# Patient Record
Sex: Female | Born: 1945 | Race: White | Hispanic: No | Marital: Married | Smoking: Never smoker
Health system: Southern US, Community
[De-identification: ages and names within clinical notes are randomized; demographics above are authoritative.]

## PROBLEM LIST (undated history)

## (undated) DIAGNOSIS — I341 Nonrheumatic mitral (valve) prolapse: Secondary | ICD-10-CM

## (undated) DIAGNOSIS — F329 Major depressive disorder, single episode, unspecified: Secondary | ICD-10-CM

## (undated) DIAGNOSIS — K219 Gastro-esophageal reflux disease without esophagitis: Secondary | ICD-10-CM

## (undated) DIAGNOSIS — M8580 Other specified disorders of bone density and structure, unspecified site: Secondary | ICD-10-CM

## (undated) DIAGNOSIS — K449 Diaphragmatic hernia without obstruction or gangrene: Secondary | ICD-10-CM

## (undated) DIAGNOSIS — R51 Headache: Secondary | ICD-10-CM

## (undated) DIAGNOSIS — F32A Depression, unspecified: Secondary | ICD-10-CM

## (undated) DIAGNOSIS — C569 Malignant neoplasm of unspecified ovary: Secondary | ICD-10-CM

## (undated) DIAGNOSIS — R011 Cardiac murmur, unspecified: Secondary | ICD-10-CM

## (undated) DIAGNOSIS — M719 Bursopathy, unspecified: Secondary | ICD-10-CM

## (undated) DIAGNOSIS — K529 Noninfective gastroenteritis and colitis, unspecified: Secondary | ICD-10-CM

## (undated) DIAGNOSIS — E7800 Pure hypercholesterolemia, unspecified: Secondary | ICD-10-CM

## (undated) DIAGNOSIS — B029 Zoster without complications: Secondary | ICD-10-CM

## (undated) HISTORY — DX: Bursopathy, unspecified: M71.9

## (undated) HISTORY — DX: Headache: R51

## (undated) HISTORY — DX: Gastro-esophageal reflux disease without esophagitis: K21.9

## (undated) HISTORY — DX: Depression, unspecified: F32.A

## (undated) HISTORY — DX: Noninfective gastroenteritis and colitis, unspecified: K52.9

## (undated) HISTORY — DX: Nonrheumatic mitral (valve) prolapse: I34.1

## (undated) HISTORY — DX: Diaphragmatic hernia without obstruction or gangrene: K44.9

## (undated) HISTORY — PX: BREAST EXCISIONAL BIOPSY: SUR124

## (undated) HISTORY — DX: Other specified disorders of bone density and structure, unspecified site: M85.80

## (undated) HISTORY — PX: ABDOMINAL HYSTERECTOMY: SHX81

## (undated) HISTORY — DX: Cardiac murmur, unspecified: R01.1

## (undated) HISTORY — DX: Pure hypercholesterolemia, unspecified: E78.00

## (undated) HISTORY — DX: Malignant neoplasm of unspecified ovary: C56.9

## (undated) HISTORY — DX: Zoster without complications: B02.9

## (undated) HISTORY — DX: Major depressive disorder, single episode, unspecified: F32.9

---
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
## 1977-12-08 HISTORY — PX: NASAL SEPTUM SURGERY: SHX37

## 1998-07-16 ENCOUNTER — Other Ambulatory Visit: Admission: RE | Admit: 1998-07-16 | Discharge: 1998-07-16 | Payer: Self-pay | Admitting: Obstetrics and Gynecology

## 1999-08-16 ENCOUNTER — Other Ambulatory Visit: Admission: RE | Admit: 1999-08-16 | Discharge: 1999-08-16 | Payer: Self-pay | Admitting: Obstetrics and Gynecology

## 1999-09-08 HISTORY — PX: BREAST BIOPSY: SHX20

## 1999-09-27 ENCOUNTER — Ambulatory Visit (HOSPITAL_BASED_OUTPATIENT_CLINIC_OR_DEPARTMENT_OTHER): Admission: RE | Admit: 1999-09-27 | Discharge: 1999-09-27 | Payer: Self-pay | Admitting: *Deleted

## 1999-12-16 ENCOUNTER — Encounter: Admission: RE | Admit: 1999-12-16 | Discharge: 1999-12-16 | Payer: Self-pay | Admitting: Obstetrics and Gynecology

## 1999-12-16 ENCOUNTER — Encounter: Payer: Self-pay | Admitting: Obstetrics and Gynecology

## 2000-08-07 ENCOUNTER — Other Ambulatory Visit: Admission: RE | Admit: 2000-08-07 | Discharge: 2000-08-07 | Payer: Self-pay | Admitting: Obstetrics and Gynecology

## 2000-10-13 ENCOUNTER — Encounter: Payer: Self-pay | Admitting: Obstetrics and Gynecology

## 2000-10-13 ENCOUNTER — Encounter: Admission: RE | Admit: 2000-10-13 | Discharge: 2000-10-13 | Payer: Self-pay | Admitting: Obstetrics and Gynecology

## 2000-12-18 ENCOUNTER — Encounter: Admission: RE | Admit: 2000-12-18 | Discharge: 2000-12-18 | Payer: Self-pay | Admitting: Obstetrics and Gynecology

## 2000-12-18 ENCOUNTER — Encounter: Payer: Self-pay | Admitting: Obstetrics and Gynecology

## 2001-08-23 ENCOUNTER — Other Ambulatory Visit: Admission: RE | Admit: 2001-08-23 | Discharge: 2001-08-23 | Payer: Self-pay | Admitting: Obstetrics and Gynecology

## 2001-12-24 ENCOUNTER — Encounter: Admission: RE | Admit: 2001-12-24 | Discharge: 2001-12-24 | Payer: Self-pay | Admitting: Obstetrics and Gynecology

## 2001-12-24 ENCOUNTER — Encounter: Payer: Self-pay | Admitting: Obstetrics and Gynecology

## 2002-08-26 ENCOUNTER — Other Ambulatory Visit: Admission: RE | Admit: 2002-08-26 | Discharge: 2002-08-26 | Payer: Self-pay | Admitting: Obstetrics and Gynecology

## 2002-12-26 ENCOUNTER — Encounter: Payer: Self-pay | Admitting: Obstetrics and Gynecology

## 2002-12-26 ENCOUNTER — Encounter: Admission: RE | Admit: 2002-12-26 | Discharge: 2002-12-26 | Payer: Self-pay | Admitting: Obstetrics and Gynecology

## 2003-09-01 ENCOUNTER — Other Ambulatory Visit: Admission: RE | Admit: 2003-09-01 | Discharge: 2003-09-01 | Payer: Self-pay | Admitting: Obstetrics and Gynecology

## 2003-11-24 ENCOUNTER — Encounter: Admission: RE | Admit: 2003-11-24 | Discharge: 2003-11-24 | Payer: Self-pay | Admitting: Family Medicine

## 2003-12-28 ENCOUNTER — Encounter: Admission: RE | Admit: 2003-12-28 | Discharge: 2003-12-28 | Payer: Self-pay | Admitting: Family Medicine

## 2004-09-19 ENCOUNTER — Other Ambulatory Visit: Admission: RE | Admit: 2004-09-19 | Discharge: 2004-09-19 | Payer: Self-pay | Admitting: *Deleted

## 2005-01-30 ENCOUNTER — Encounter: Admission: RE | Admit: 2005-01-30 | Discharge: 2005-01-30 | Payer: Self-pay | Admitting: Obstetrics and Gynecology

## 2005-09-23 ENCOUNTER — Other Ambulatory Visit: Admission: RE | Admit: 2005-09-23 | Discharge: 2005-09-23 | Payer: Self-pay | Admitting: Obstetrics and Gynecology

## 2006-01-30 ENCOUNTER — Encounter: Admission: RE | Admit: 2006-01-30 | Discharge: 2006-01-30 | Payer: Self-pay | Admitting: Family Medicine

## 2006-02-05 ENCOUNTER — Encounter: Admission: RE | Admit: 2006-02-05 | Discharge: 2006-02-05 | Payer: Self-pay | Admitting: Obstetrics and Gynecology

## 2006-10-05 ENCOUNTER — Other Ambulatory Visit: Admission: RE | Admit: 2006-10-05 | Discharge: 2006-10-05 | Payer: Self-pay | Admitting: Obstetrics & Gynecology

## 2007-02-09 ENCOUNTER — Encounter: Admission: RE | Admit: 2007-02-09 | Discharge: 2007-02-09 | Payer: Self-pay | Admitting: Obstetrics & Gynecology

## 2007-10-21 ENCOUNTER — Other Ambulatory Visit: Admission: RE | Admit: 2007-10-21 | Discharge: 2007-10-21 | Payer: Self-pay | Admitting: Obstetrics & Gynecology

## 2008-03-16 ENCOUNTER — Encounter: Admission: RE | Admit: 2008-03-16 | Discharge: 2008-03-16 | Payer: Self-pay | Admitting: Family Medicine

## 2008-10-25 ENCOUNTER — Other Ambulatory Visit: Admission: RE | Admit: 2008-10-25 | Discharge: 2008-10-25 | Payer: Self-pay | Admitting: Obstetrics & Gynecology

## 2009-03-19 ENCOUNTER — Encounter: Admission: RE | Admit: 2009-03-19 | Discharge: 2009-03-19 | Payer: Self-pay | Admitting: Obstetrics & Gynecology

## 2010-02-18 ENCOUNTER — Encounter: Admission: RE | Admit: 2010-02-18 | Discharge: 2010-02-18 | Payer: Self-pay | Admitting: Internal Medicine

## 2010-03-08 DIAGNOSIS — B029 Zoster without complications: Secondary | ICD-10-CM

## 2010-03-08 HISTORY — DX: Zoster without complications: B02.9

## 2010-03-20 ENCOUNTER — Encounter: Admission: RE | Admit: 2010-03-20 | Discharge: 2010-03-20 | Payer: Self-pay | Admitting: Obstetrics & Gynecology

## 2011-02-24 ENCOUNTER — Other Ambulatory Visit: Payer: Self-pay | Admitting: Obstetrics & Gynecology

## 2011-02-24 DIAGNOSIS — Z1231 Encounter for screening mammogram for malignant neoplasm of breast: Secondary | ICD-10-CM

## 2011-03-25 ENCOUNTER — Ambulatory Visit
Admission: RE | Admit: 2011-03-25 | Discharge: 2011-03-25 | Disposition: A | Discharge status: Home / Self Care | Payer: BLUE CROSS/BLUE SHIELD | Source: Ambulatory Visit | Attending: Obstetrics & Gynecology | Admitting: Obstetrics & Gynecology

## 2011-03-25 DIAGNOSIS — Z1231 Encounter for screening mammogram for malignant neoplasm of breast: Secondary | ICD-10-CM

## 2011-07-17 ENCOUNTER — Ambulatory Visit (HOSPITAL_COMMUNITY)
Admission: RE | Admit: 2011-07-17 | Discharge: 2011-07-17 | Disposition: A | Discharge status: Home / Self Care | Payer: BC Managed Care – PPO | Source: Ambulatory Visit | Attending: Gastroenterology | Admitting: Gastroenterology

## 2011-07-17 ENCOUNTER — Other Ambulatory Visit: Payer: Self-pay | Admitting: Gastroenterology

## 2011-07-17 DIAGNOSIS — E785 Hyperlipidemia, unspecified: Secondary | ICD-10-CM | POA: Insufficient documentation

## 2011-07-17 DIAGNOSIS — Z79899 Other long term (current) drug therapy: Secondary | ICD-10-CM | POA: Insufficient documentation

## 2011-07-17 DIAGNOSIS — R197 Diarrhea, unspecified: Secondary | ICD-10-CM | POA: Insufficient documentation

## 2011-07-30 ENCOUNTER — Other Ambulatory Visit: Payer: Self-pay | Admitting: Gastroenterology

## 2011-07-31 ENCOUNTER — Ambulatory Visit
Admission: RE | Admit: 2011-07-31 | Discharge: 2011-07-31 | Disposition: A | Discharge status: Home / Self Care | Payer: BC Managed Care – PPO | Source: Ambulatory Visit | Attending: Gastroenterology | Admitting: Gastroenterology

## 2011-07-31 NOTE — Op Note (Signed)
  NAME:  FARREN, LANDA NO.:  [id number]  MEDICAL RECORD NO.:  [id number]  LOCATION:  [facility]                         FACILITY:  [facility]  PHYSICIAN:  Danise Edge, M.D.   DATE OF BIRTH:  [birth year]  DATE OF PROCEDURE: DATE OF DISCHARGE:                              OPERATIVE REPORT   PROCEDURE:  Diagnostic colonoscopy.  REFERRING PHYSICIAN:  Pam Drown, M.D.  HISTORY:  Ms. Candace Manning is a 65 year old female born on 1946-02-01.  The patient underwent a normal screening colonoscopy in December 2002.  Ms. Rzasa has unexplained, intermittent watery, nonbloody diarrhea without abdominal pain, fever, anorexia, vomiting, or gastrointestinal bleeding.  The patient is scheduled to undergo a diagnostic colonoscopy to rule out colorectal neoplasia and microscopic colitis.  ENDOSCOPIST:  Danise Edge, M.D.  PREMEDICATION:  Fentanyl 75 mcg, Versed 5 mg.  DESCRIPTION OF PROCEDURE:  The patient was placed in the left lateral decubitus position.  Anal inspection and digital rectal exam were normal.  The Pentax pediatric colonoscope was introduced into the rectum and easily advanced to the cecum.  A normal-appearing ileocecal valve and appendiceal orifice were identified.  Colonic preparation for the exam today was good.  Rectum normal.  Retroflex view of the distal rectum normal.  Sigmoid colon and descending colon normal.  Splenic flexure normal.  Transverse colon normal.  Hepatic flexure normal.  Ascending colon normal.  Cecum and ileocecal valve normal.  BIOPSIES:  Random colon biopsies were performed from the right colon and left colon to look for microscopic colitis.  ASSESSMENT:  Normal screening proctocolonoscopy to the cecum.  Random colon biopsies to look for microscopic colitis pending.  RECOMMENDATIONS:  Schedule repeat screening colonoscopy in 10 years.          ______________________________ Danise Edge,  M.D.     MJ/MEDQ  D:  07/17/2011  T:  07/17/2011  Job:  [id number]  cc:   Pam Drown, M.D. Fax: [phone]  Electronically Signed by Danise Edge M.D. on 07/31/2011 04:47:44 PM

## 2012-03-02 ENCOUNTER — Other Ambulatory Visit: Payer: Self-pay | Admitting: Obstetrics & Gynecology

## 2012-03-02 DIAGNOSIS — Z1231 Encounter for screening mammogram for malignant neoplasm of breast: Secondary | ICD-10-CM

## 2012-03-26 ENCOUNTER — Ambulatory Visit
Admission: RE | Admit: 2012-03-26 | Discharge: 2012-03-26 | Disposition: A | Discharge status: Home / Self Care | Payer: BC Managed Care – PPO | Source: Ambulatory Visit | Attending: Obstetrics & Gynecology | Admitting: Obstetrics & Gynecology

## 2012-03-26 DIAGNOSIS — Z1231 Encounter for screening mammogram for malignant neoplasm of breast: Secondary | ICD-10-CM

## 2013-03-01 ENCOUNTER — Encounter: Payer: Self-pay | Admitting: Obstetrics & Gynecology

## 2013-03-01 ENCOUNTER — Ambulatory Visit (INDEPENDENT_AMBULATORY_CARE_PROVIDER_SITE_OTHER): Payer: BC Managed Care – PPO | Admitting: Obstetrics & Gynecology

## 2013-03-01 VITALS — BP 132/88 | Ht 64.5 in | Wt 136.4 lb

## 2013-03-01 DIAGNOSIS — N951 Menopausal and female climacteric states: Secondary | ICD-10-CM

## 2013-03-01 DIAGNOSIS — Z0000 Encounter for general adult medical examination without abnormal findings: Secondary | ICD-10-CM

## 2013-03-01 DIAGNOSIS — Z01419 Encounter for gynecological examination (general) (routine) without abnormal findings: Secondary | ICD-10-CM

## 2013-03-01 LAB — POCT URINALYSIS DIPSTICK
Bilirubin, UA: NEGATIVE
Glucose, UA: NEGATIVE
Leukocytes, UA: NEGATIVE
Nitrite, UA: NEGATIVE
pH, UA: 5

## 2013-03-01 MED ORDER — PROGESTERONE MICRONIZED 100 MG PO CAPS: 100.0000 mg | ORAL_CAPSULE | Freq: Every day | ORAL | Status: DC

## 2013-03-01 MED ORDER — ESTRADIOL 0.05 MG/24HR TD PTTW: 1.0000 | MEDICATED_PATCH | TRANSDERMAL | Status: DC

## 2013-03-01 NOTE — Progress Notes (Signed)
67 y.o. G1P1 MarriedCaucasianF here for annual exam.  Doing well.  Has new grandson--Rede--born in June.  No vaginal bleeding.  Ate an almond--broke crown.  Now $4500 later, she has an implant.  No LMP recorded. Patient is postmenopausal.          Sexually active: yes  The current method of family planning is post menopausal status.    Exercising: yes  Gym/ health club routine includes light weights, stationary bike and treadmill. Smoker:  no  Health Maintenance: Pap:  02/03/12 WNL/ negative HR HPV MMG:  03/26/12 normal  Colonoscopy:  8/13  BMD:   08/27/12 -1.3 TDaP:  11/09   reports that she has never smoked. She has never used smokeless tobacco. She reports that  drinks alcohol. She reports that she does not use illicit drugs.  Past Medical History  Diagnosis Date  . Heart murmur   . Mitral valve prolapse   . Bursitis   . Headache     migraines with dizziness  . Osteopenia   . Shingles 03/2010  . Depression   . Elevated cholesterol     Past Surgical History  Procedure Laterality Date  . Tonsillectomy  1954  . Nasal septum surgery  1979  . Breast biopsy  09/1999    left breast-benign    Current Outpatient Prescriptions  Medication Sig Dispense Refill  . Biotin 1000 MCG tablet Take 1,000 mcg by mouth daily.      . calcium citrate-vitamin D (CITRACAL+D) 315-200 MG-UNIT per tablet       . Cholecalciferol (VITAMIN D PO) Take 3,500 Int'l Units by mouth.      . diclofenac (FLECTOR) 1.3 % PTCH Place 1 patch onto the skin as needed.      Marland Kitchen esomeprazole (NEXIUM) 40 MG capsule Take 40 mg by mouth daily before breakfast.      . estradiol (VIVELLE-DOT) 0.0375 MG/24HR Place 1 patch onto the skin 2 (two) times a week.      Marland Kitchen FLUoxetine (PROZAC) 20 MG capsule Take 20 mg by mouth daily.      Marland Kitchen latanoprost (XALATAN) 0.005 % ophthalmic solution 1 drop at bedtime.      . Levocetirizine Dihydrochloride (XYZAL PO) Take by mouth.      . Multiple Vitamins-Minerals (MULTIVITAMIN PO) Take by  mouth.      . Omega-3 Fatty Acids (FISH OIL PO) Take 4,000 mg by mouth daily.      . progesterone (PROMETRIUM) 100 MG capsule 100 mg. Po q hs day 1-15 monthly      . rosuvastatin (CRESTOR) 20 MG tablet Take 20 mg by mouth daily.      . sodium fluoride-calcium carbonate (FLORICAL) 8.3-364 MG CAPS Take 1 capsule by mouth 2 (two) times daily.       No current facility-administered medications for this visit.    Family History  Problem Relation Age of Onset  . Cancer Mother 21    ovarian  . Cancer Maternal Grandmother     breast  . Cancer Maternal Aunt     breast  . Diabetes Maternal Grandfather   . Cancer Paternal Aunt     breast  . Hypertension Father   . Aneurysm Father     brain    ROS:  Pertinent items are noted in HPI.  Otherwise, a comprehensive ROS was negative.  Exam:   BP 132/88  Ht 5' 4.5" (1.638 m)  Wt 136 lb 6.4 oz (61.871 kg)  BMI 23.06 kg/m2  Weight change: @WEIGHTCHANGE @  Height:   Height: 5' 4.5" (163.8 cm)  Ht Readings from Last 3 Encounters:  03/01/13 5' 4.5" (1.638 m)    General appearance: alert, cooperative and appears stated age Head: Normocephalic, without obvious abnormality, atraumatic Neck: no adenopathy, supple, symmetrical, trachea midline and thyroid not enlarged, symmetric, no tenderness/mass/nodules Lungs: clear to auscultation bilaterally Breasts: Inspection negative, No nipple retraction or dimpling, No nipple discharge or bleeding, No axillary or supraclavicular adenopathy, Normal to palpation without dominant masses Heart: regular rate and rhythm Abdomen: soft, non-tender; bowel sounds normal; no masses,  no organomegaly Extremities: extremities normal, atraumatic, no cyanosis or edema Skin: Skin color, texture, turgor normal. No rashes or lesions Lymph nodes: Cervical, supraclavicular, and axillary nodes normal. No abnormal inguinal nodes palpated Neurologic: Grossly normal   Pelvic: External genitalia:  no lesions               Urethra:  normal appearing urethra with no masses, tenderness or lesions              Bartholins and Skenes: normal                 Vagina: normal appearing vagina with normal color and discharge, no lesions              Cervix: no lesions              Pap taken: no Bimanual Exam:  Uterus:  normal size, contour, position, consistency, mobility, non-tender              Adnexa: no mass, fullness, tenderness               Rectovaginal: Confirms               Anus:  normal sphincter tone, no lesions  A:  Well Woman with normal exam  P:    Decrease HRT dose to 0.025mg . Continue the prometrium 100mg  days 1-15 each month. Mammogram yearly--discussed with patient 3D mammogram return annually or prn  An After Visit Summary was printed and given to the patient.

## 2013-03-01 NOTE — Patient Instructions (Signed)

## 2013-03-02 ENCOUNTER — Other Ambulatory Visit: Payer: Self-pay

## 2013-03-02 DIAGNOSIS — Z1231 Encounter for screening mammogram for malignant neoplasm of breast: Secondary | ICD-10-CM

## 2013-03-30 ENCOUNTER — Ambulatory Visit
Admission: RE | Admit: 2013-03-30 | Discharge: 2013-03-30 | Disposition: A | Discharge status: Home / Self Care | Payer: Medicare Other | Source: Ambulatory Visit

## 2013-03-30 DIAGNOSIS — Z1231 Encounter for screening mammogram for malignant neoplasm of breast: Secondary | ICD-10-CM

## 2013-04-18 ENCOUNTER — Telehealth: Payer: Self-pay | Admitting: Obstetrics & Gynecology

## 2013-04-18 NOTE — Telephone Encounter (Signed)
Since Minivelle doesn't make a 0.025 mg patch, just prescribed the 0.05mg  twice weekly patch and tell pt to cut in half.  OK to RF through [facility].

## 2013-04-18 NOTE — Telephone Encounter (Signed)
Dr Hyacinth Meeker, we gave her samples of the minivelle 0.05mg  to cut 1/2. Please advise how to refill?

## 2013-04-18 NOTE — Telephone Encounter (Signed)
.  05 Minivelle working well and patient requesting an rx please. Candace Manning [phone]

## 2013-04-21 NOTE — Telephone Encounter (Signed)
Patient notified

## 2013-04-21 NOTE — Telephone Encounter (Signed)
Patient returning Kelly's call. °

## 2013-09-01 ENCOUNTER — Telehealth: Payer: Self-pay | Admitting: Obstetrics & Gynecology

## 2013-09-01 NOTE — Telephone Encounter (Signed)
Spoke with patient she's been having breast pain since 08/08/13, not sure if nerve or shingles related Had the shingles vaccine in 2009. Has just been watching and waiting to see  if pain got better or worse Decided to call for an appt.Marland Kitchen Appointment scheduled w/Dr. Hyacinth Meeker for 09/06/13 ask if this was ok She said yes. Please advise.

## 2013-09-01 NOTE — Telephone Encounter (Signed)
Patient is having some breast pain. Patient thinks it may be related to shingles. Patient would like to talk to a nurse.

## 2013-09-01 NOTE — Telephone Encounter (Signed)
This is fine. Thank you.

## 2013-09-06 ENCOUNTER — Encounter: Payer: Self-pay | Admitting: Obstetrics & Gynecology

## 2013-09-06 ENCOUNTER — Ambulatory Visit (INDEPENDENT_AMBULATORY_CARE_PROVIDER_SITE_OTHER): Payer: Medicare Other | Admitting: Obstetrics & Gynecology

## 2013-09-06 VITALS — BP 132/80 | HR 68 | Resp 16 | Ht 64.25 in | Wt 140.4 lb

## 2013-09-06 DIAGNOSIS — N644 Mastodynia: Secondary | ICD-10-CM

## 2013-09-06 NOTE — Progress Notes (Signed)
Appointment made for Mammogram. Appointment made with patient in office for [facility] 10/8 at 1:30 patient agreeable to time/date.

## 2013-09-06 NOTE — Progress Notes (Signed)
Subjective:     Patient ID: Candace Manning, female   DOB: 11-09-1946, 67 y.o.   MRN: [id number]  HPI 67 yo G1P1 MWF here with left sided breast pain.  This is in the same distribution as shingles in 2010.  No recent trauma.  She reports when she has pain, it is a shooting pain and like she can feel twitches.  No shoulder pain.  Normal ROM in right should.  No neck or jaw pain.    Pain had similar issues 3/11.  Ended up seeing ortho after having diagnostic MMG.  Was diagnosed with atypical shingles.  Was not treated with anything but ibuprofen.    No new bra.  No new exercises.  Has been going on since late July.    Review of Systems  All other systems reviewed and are negative.       Objective:   Physical Exam  Constitutional: She is oriented to person, place, and time. She appears well-developed and well-nourished.  Neck: Normal range of motion. Neck supple. No thyromegaly present.  Pulmonary/Chest:    Lymphadenopathy:    She has no cervical adenopathy.  Neurological: She is alert and oriented to person, place, and time.  Skin: Skin is warm.  Psychiatric: She has a normal mood and affect.       Assessment:     Breast pain     Plan:     Diagnostic Right MMG Stop all caffeine 200-400 IU Vit E Will recheck 2-4 weeks

## 2013-09-14 ENCOUNTER — Ambulatory Visit
Admission: RE | Admit: 2013-09-14 | Discharge: 2013-09-14 | Disposition: A | Discharge status: Home / Self Care | Payer: 59 | Source: Ambulatory Visit | Attending: Obstetrics & Gynecology | Admitting: Obstetrics & Gynecology

## 2013-09-14 DIAGNOSIS — N644 Mastodynia: Secondary | ICD-10-CM

## 2013-09-19 ENCOUNTER — Telehealth: Payer: Self-pay | Admitting: Emergency Medicine

## 2013-09-19 NOTE — Telephone Encounter (Signed)
Message copied by Joeseph Amor on Mon Sep 19, 2013 10:04 AM ------      Message from: Jerene Bears      Created: Wed Sep 14, 2013  3:33 PM       Please call pt.  MMG negative.  Don't know if was in MMG holds.  IF so, remove.  IF pain is resolved as MMG note says, no f/u needed.  If still bothering pt, schedule f/u in 1-2 weeks. ------

## 2013-09-19 NOTE — Telephone Encounter (Signed)
Message left to return call to Candace Manning at [phone].    

## 2013-09-20 NOTE — Telephone Encounter (Signed)
S/W Patient. States that pain stopped after 9/28. Is using vitamin E, and has stopped Caffeine.  Will call if pain returns.

## 2013-09-23 ENCOUNTER — Telehealth: Payer: Self-pay | Admitting: *Deleted

## 2013-09-23 NOTE — Telephone Encounter (Signed)
Pt states two toenails were diagnosed with a bacterial infection and after using OTC topical polish AquaCare, the nails are still yellow.  Is there anything else?

## 2013-10-06 NOTE — Telephone Encounter (Signed)
Dr Irving Shows states that laser would be the best for the pt.  I informed pt of orders and she states that she will discuss with her husband when they return from [place].

## 2013-10-13 ENCOUNTER — Other Ambulatory Visit: Payer: Self-pay

## 2013-11-24 ENCOUNTER — Telehealth: Payer: Self-pay | Admitting: Obstetrics & Gynecology

## 2013-11-24 NOTE — Telephone Encounter (Signed)
Pt says starting in January [facility] will not cover the minivelle. Pt would like to speak with nurse regarding the letter she received regarding this.

## 2013-11-25 NOTE — Telephone Encounter (Signed)
Spoke with patient.  She is going to bring in Prior Authorization letter. I advised we will try to obtain prior authorization. Also, we discussed savings card to use as well, patient will try to use. Savings card left at front desk for her. She is only using half of a minivelle patch so savings card may help more if cannot obtain coverage from medicare.  Routing to provider for final review. Patient agreeable to disposition. Will close encounter

## 2013-11-28 ENCOUNTER — Telehealth: Payer: Self-pay | Admitting: Obstetrics & Gynecology

## 2013-11-28 NOTE — Telephone Encounter (Signed)
Patient dropped off prior authorization request. We fax when finished.

## 2014-02-06 ENCOUNTER — Other Ambulatory Visit: Payer: Self-pay

## 2014-02-06 ENCOUNTER — Telehealth: Payer: Self-pay | Admitting: Obstetrics & Gynecology

## 2014-02-06 DIAGNOSIS — Z1231 Encounter for screening mammogram for malignant neoplasm of breast: Secondary | ICD-10-CM

## 2014-02-06 NOTE — Telephone Encounter (Signed)
Spoke with patient. She read the letter to me from the breast center. The letter is reminding patient to schedule a follow up screening mammogram that will be due after 03/29/14. Advised patient that she can call to schedule Mammogram as she is due for screening after 03/29/14. Patient states that she continues to not have breast pain. Patient will call [facility] and schedule her next screening.    Routing to provider for final review. Patient agreeable to disposition. Will close encounter

## 2014-02-06 NOTE — Telephone Encounter (Signed)
Patient states she received a letter from The [facility] center saying she needed another diagnostic mammogram or a floow up with them. Is a little confused as to what she needs to do. Wants to talk to nurse

## 2014-03-07 ENCOUNTER — Other Ambulatory Visit: Payer: Self-pay | Admitting: Obstetrics & Gynecology

## 2014-04-06 ENCOUNTER — Ambulatory Visit: Payer: Medicare Other

## 2014-04-14 ENCOUNTER — Ambulatory Visit
Admission: RE | Admit: 2014-04-14 | Discharge: 2014-04-14 | Disposition: A | Discharge status: Home / Self Care | Payer: 59 | Source: Ambulatory Visit

## 2014-04-14 DIAGNOSIS — Z1231 Encounter for screening mammogram for malignant neoplasm of breast: Secondary | ICD-10-CM

## 2014-05-08 DIAGNOSIS — C569 Malignant neoplasm of unspecified ovary: Secondary | ICD-10-CM

## 2014-05-08 HISTORY — DX: Malignant neoplasm of unspecified ovary: C56.9

## 2014-05-19 ENCOUNTER — Ambulatory Visit (INDEPENDENT_AMBULATORY_CARE_PROVIDER_SITE_OTHER): Payer: Medicare Other | Admitting: Obstetrics & Gynecology

## 2014-05-19 ENCOUNTER — Ambulatory Visit
Admission: RE | Admit: 2014-05-19 | Discharge: 2014-05-19 | Disposition: A | Discharge status: Home / Self Care | Payer: 59 | Source: Ambulatory Visit | Attending: Obstetrics & Gynecology | Admitting: Obstetrics & Gynecology

## 2014-05-19 ENCOUNTER — Encounter: Payer: Self-pay | Admitting: Obstetrics & Gynecology

## 2014-05-19 VITALS — BP 122/64 | HR 64 | Resp 16 | Ht 64.0 in | Wt 133.6 lb

## 2014-05-19 DIAGNOSIS — K52839 Microscopic colitis, unspecified: Secondary | ICD-10-CM | POA: Insufficient documentation

## 2014-05-19 DIAGNOSIS — R109 Unspecified abdominal pain: Secondary | ICD-10-CM

## 2014-05-19 DIAGNOSIS — N83202 Unspecified ovarian cyst, left side: Secondary | ICD-10-CM

## 2014-05-19 DIAGNOSIS — M8580 Other specified disorders of bone density and structure, unspecified site: Secondary | ICD-10-CM | POA: Insufficient documentation

## 2014-05-19 DIAGNOSIS — N83201 Unspecified ovarian cyst, right side: Secondary | ICD-10-CM

## 2014-05-19 DIAGNOSIS — K449 Diaphragmatic hernia without obstruction or gangrene: Secondary | ICD-10-CM | POA: Insufficient documentation

## 2014-05-19 DIAGNOSIS — Z79890 Hormone replacement therapy (postmenopausal): Secondary | ICD-10-CM | POA: Insufficient documentation

## 2014-05-19 DIAGNOSIS — R197 Diarrhea, unspecified: Secondary | ICD-10-CM

## 2014-05-19 DIAGNOSIS — K219 Gastro-esophageal reflux disease without esophagitis: Secondary | ICD-10-CM | POA: Insufficient documentation

## 2014-05-19 DIAGNOSIS — N951 Menopausal and female climacteric states: Secondary | ICD-10-CM

## 2014-05-19 DIAGNOSIS — Z01419 Encounter for gynecological examination (general) (routine) without abnormal findings: Secondary | ICD-10-CM

## 2014-05-19 DIAGNOSIS — M949 Disorder of cartilage, unspecified: Secondary | ICD-10-CM

## 2014-05-19 DIAGNOSIS — K5289 Other specified noninfective gastroenteritis and colitis: Secondary | ICD-10-CM

## 2014-05-19 DIAGNOSIS — M899 Disorder of bone, unspecified: Secondary | ICD-10-CM

## 2014-05-19 DIAGNOSIS — Z124 Encounter for screening for malignant neoplasm of cervix: Secondary | ICD-10-CM

## 2014-05-19 LAB — BASIC METABOLIC PANEL
BUN: 11 mg/dL (ref 6–23)
CALCIUM: 8.6 mg/dL (ref 8.4–10.5)
CO2: 24 mEq/L (ref 19–32)
Chloride: 104 mEq/L (ref 96–112)
Creat: 0.7 mg/dL (ref 0.50–1.10)
Glucose, Bld: 75 mg/dL (ref 70–99)
POTASSIUM: 4.4 meq/L (ref 3.5–5.3)
SODIUM: 140 meq/L (ref 135–145)

## 2014-05-19 LAB — CBC
HCT: 42.8 % (ref 36.0–46.0)
Hemoglobin: 13.9 g/dL (ref 12.0–15.0)
MCH: 31.6 pg (ref 26.0–34.0)
MCHC: 32.5 g/dL (ref 30.0–36.0)
MCV: 97.2 fL (ref 78.0–100.0)
PLATELETS: 304 10*3/uL (ref 150–400)
RBC: 4.4 MIL/uL (ref 3.87–5.11)
RDW: 12.4 % (ref 11.5–15.5)
WBC: 10.3 10*3/uL (ref 4.0–10.5)

## 2014-05-19 MED ORDER — IOHEXOL 300 MG/ML  SOLN
100.0000 mL | Freq: Once | INTRAMUSCULAR | Status: AC | PRN
  Administered 2014-05-19: 100 mL via INTRAVENOUS

## 2014-05-19 MED ORDER — ESTRADIOL 0.025 MG/24HR TD PTTW: 1.0000 | MEDICATED_PATCH | TRANSDERMAL | Status: DC

## 2014-05-19 MED ORDER — CIPROFLOXACIN HCL 500 MG PO TABS: 500.0000 mg | ORAL_TABLET | Freq: Two times a day (BID) | ORAL | Status: DC

## 2014-05-19 MED ORDER — PROGESTERONE MICRONIZED 100 MG PO CAPS: 100.0000 mg | ORAL_CAPSULE | Freq: Every day | ORAL | Status: DC

## 2014-05-19 MED ORDER — METRONIDAZOLE 500 MG PO TABS: 500.0000 mg | ORAL_TABLET | Freq: Two times a day (BID) | ORAL | Status: DC

## 2014-05-19 NOTE — Progress Notes (Signed)
Notified Vickie at [facility] patients labs are back. BUN/Creatinine WNL. Gave authorization code for CT [phone]. Case ID [id number], spoke with [employer] clinical reviewer

## 2014-05-19 NOTE — Progress Notes (Signed)
68 y.o. G1P1 MarriedCaucasianF here for annual exam.  No vaginal bleeding.  Now on Vivelle 0.025mg  dot with prometrium.  PCP is dr. Addison Lank.  She was off cholesterol meds for 8 weeks.  Now back on crestor.  She has follow up appt at end of the month.  Off crestor, cholesterol was just bad!!  Having a lot of abdominal pain for the last four to five days.  Has been eating a lot of greens and fiber.  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  Changes gastroenterologists within past two years to Dr. Collene Mares.  Pt not sure if issues are gynecologic or GI.  No vaginal discharge.  No fever.  Drove back yesterday from family member several hours away.  Was uncomfortable enough she needed to lie across the back seat in car.  Feels ok until she eats and then just feels abdomen gurgle and bubble.  Feels like she needs to pass a large amount of gas but just can't.  Does have some anxiety about ovarian cancer as well.    Patient's last menstrual period was 02/05/2009.          Sexually active: yes  The current method of family planning is post menopausal status.    Exercising: yes  treadmill, elipitical, and bike Smoker:  no  Health Maintenance: Pap:  02/03/12 WNL/negative HR HPV History of abnormal Pap:  no MMG:  04/14/14 3D-normal Colonoscopy:  07/17/11-Dr. Wynetta Emery BMD:   08/27/12, 1.3/-1.3 TDaP:  11/09 Screening Labs: PCP, Hb today: PCP, Urine today: PROTEIN-trace, RBC-trace   reports that she has never smoked. She has never used smokeless tobacco. She reports that she drinks about 1.5 - 2 ounces of alcohol per week. She reports that she does not use illicit drugs.  Past Medical History  Diagnosis Date  . Heart murmur   . Mitral valve prolapse   . Bursitis   . Headache(784.0)     migraines with dizziness  . Osteopenia   . Shingles 03/2010  . Depression   . Elevated cholesterol   . Hiatal hernia     Dr. Collene Mares  . Diverticulitis     microscopic, Dr. Wynetta Emery  . GERD (gastroesophageal reflux disease)      Past Surgical History  Procedure Laterality Date  . Tonsillectomy  1954  . Nasal septum surgery  1979  . Breast biopsy  09/1999    left breast-benign    Current Outpatient Prescriptions  Medication Sig Dispense Refill  . Biotin 1000 MCG tablet Take 1,000 mcg by mouth daily.      . calcium citrate-vitamin D (CITRACAL+D) 315-200 MG-UNIT per tablet       . Cholecalciferol (VITAMIN D PO) Take 4,000 Int'l Units by mouth daily.       Marland Kitchen esomeprazole (NEXIUM) 40 MG capsule Take 40 mg by mouth daily before breakfast.      . estradiol (VIVELLE-DOT) 0.05 MG/24HR patch Place 1 patch onto the skin once a week. Pt cutting in 1/2      . FLUoxetine (PROZAC) 20 MG capsule Take 20 mg by mouth daily.      Marland Kitchen latanoprost (XALATAN) 0.005 % ophthalmic solution 1 drop at bedtime.      . Levocetirizine Dihydrochloride (XYZAL PO) Take by mouth.      . Multiple Vitamins-Minerals (MULTIVITAMIN PO) Take by mouth.      . NON FORMULARY Allergy injections-one in each are weekly      . Omega-3 Fatty Acids (FISH OIL PO) Take 4,000 mg by mouth  daily.      . Probiotic Product (PROBIOTIC DAILY PO) Take by mouth. Ultra flora B daily      . progesterone (PROMETRIUM) 100 MG capsule Take 1 capsule (100 mg total) by mouth daily. Po q hs day 1-15 monthly  15 capsule  13  . rosuvastatin (CRESTOR) 20 MG tablet Take 20 mg by mouth daily.      . sodium fluoride-calcium carbonate (FLORICAL) 8.3-364 MG CAPS Take 1 capsule by mouth 2 (two) times daily.      . vitamin E (VITAMIN E) 400 UNIT capsule Take 400 Units by mouth daily.      . diclofenac (FLECTOR) 1.3 % PTCH Place 1 patch onto the skin as needed.       No current facility-administered medications for this visit.    Family History  Problem Relation Age of Onset  . Cancer Mother 97    ovarian  . Cancer Maternal Grandmother     breast  . Cancer Maternal Aunt     breast  . Diabetes Maternal Grandfather   . Cancer Paternal Aunt     breast  . Hypertension Father    . Aneurysm Father     brain    ROS:  Pertinent items are noted in HPI.  Otherwise, a comprehensive ROS was negative.  Exam:   BP 122/64  Pulse 64  Resp 16  Ht 5\' 4"  (1.626 m)  Wt 133 lb 9.6 oz (60.601 kg)  BMI 22.92 kg/m2  LMP 02/05/2009  Height: 5\' 4"  (162.6 cm)  Ht Readings from Last 3 Encounters:  05/19/14 5\' 4"  (1.626 m)  09/06/13 5' 4.25" (1.632 m)  03/01/13 5' 4.5" (1.638 m)    General appearance: alert, cooperative and appears stated age Head: Normocephalic, without obvious abnormality, atraumatic Neck: no adenopathy, supple, symmetrical, trachea midline and thyroid normal to inspection and palpation Lungs: clear to auscultation bilaterally Breasts: normal appearance, no masses or tenderness Heart: regular rate and rhythm Abdomen: soft, diffuse tenderness, bowel sounds normal; no masses, no organomegaly, no rebound or guarding Extremities: extremities normal, atraumatic, no cyanosis or edema Skin: Skin color, texture, turgor normal. No rashes or lesions Lymph nodes: Cervical, supraclavicular, and axillary nodes normal. No abnormal inguinal nodes palpated Neurologic: Grossly normal   Pelvic: External genitalia:  no lesions              Urethra:  normal appearing urethra with no masses, tenderness or lesions              Bartholins and Skenes: normal                 Vagina: normal appearing vagina with normal color and discharge, no lesions              Cervix: no lesions              Pap taken: yes Bimanual Exam:  Uterus:  Normal size, bilateral adnexal areas are tender to palpation, uterus is mobile, no masses palpated              Adnexa: mild bilateral tenderness, no discrete masses               Rectovaginal: Confirms               Anus:  normal sphincter tone, no lesions  A:  Well Woman with normal exam PMP, on low dose HRT.  Weaning off. Osteopenia Significant tenderness to palpation on abdominal exam.  Non surgical abdomen.  ?-  able relation to hx of  microscopic colitis.  P:   Mammogram yearly.   D/W pt doing 3D MMG. pap smear only today.  H/o neg Pap with neg HR HPV 2/13. Screening labs with Dr. Addison Lank Plan to try and wean further from Vivelle dot to 0.025mg  and pt will cut in 1/2 and prometrium 100mg  day 1-12.  Rx to pharmacy. CBC, BMP, Ct abd/pelvis with contrast.  Will contact pt lateral this afternoon/evening with results. cipro 500mg  bid x 14 days and flagyl 500mg  bid x 14 day May need follow up with GI early next week. return annually or prn  An After Visit Summary was printed and given to the patient.

## 2014-05-22 ENCOUNTER — Telehealth: Payer: Self-pay | Admitting: Emergency Medicine

## 2014-05-22 DIAGNOSIS — N83209 Unspecified ovarian cyst, unspecified side: Secondary | ICD-10-CM

## 2014-05-22 LAB — IPS PAP SMEAR ONLY

## 2014-05-22 NOTE — Telephone Encounter (Signed)
Spoke with Dr.Mann's office. Appointment scheduled for tomorrow at 9:30am with Dr.Mann. Spoke with patient. Advised of appointment date and time with Dr.Mann's office. Patient agreeable. Would like to schedule PUS with Dr.Miller at this time. Patient requesting earliest PUS with Dr.Miller on Thursday. PUS scheduled for Thursday at 12:30pm with 1300 consult with Dr.Miller. Patient agreeable to date and time.  Routing to provider for final review. Patient agreeable to disposition. Will close encounter.

## 2014-05-22 NOTE — Telephone Encounter (Signed)
Patient returning Tracy's call. She says she will be at her home number all day but will be on her cell if she doesn't answer there.

## 2014-05-22 NOTE — Telephone Encounter (Signed)
Attempted call to patient's home number and no answer rang and rang. Home number per designated party release form.   Patient needs PUS with Dr. Sabra Heck and appointment with Dr. Collene Mares (current patient)

## 2014-05-22 NOTE — Telephone Encounter (Signed)
Message copied by Michele Mcalpine on Mon May 22, 2014  8:40 AM ------      Message from: Megan Salon      Created: Sun May 21, 2014 10:56 PM       Pt aware of results.  I left message for her on Sunday and she called me back to let me know she was better but not 100%.  I'd like Dr. Collene Mares to see her this week if possible and she needs a PUS with me this week if possible.  Thanks.  Order placed. ------

## 2014-05-25 ENCOUNTER — Other Ambulatory Visit: Payer: Self-pay | Admitting: Obstetrics & Gynecology

## 2014-05-25 ENCOUNTER — Ambulatory Visit (INDEPENDENT_AMBULATORY_CARE_PROVIDER_SITE_OTHER): Payer: Medicare Other

## 2014-05-25 ENCOUNTER — Ambulatory Visit (INDEPENDENT_AMBULATORY_CARE_PROVIDER_SITE_OTHER): Payer: Medicare Other | Admitting: Obstetrics & Gynecology

## 2014-05-25 ENCOUNTER — Encounter: Payer: Self-pay | Admitting: Obstetrics & Gynecology

## 2014-05-25 VITALS — BP 128/84 | Ht 64.0 in | Wt 133.0 lb

## 2014-05-25 DIAGNOSIS — N839 Noninflammatory disorder of ovary, fallopian tube and broad ligament, unspecified: Secondary | ICD-10-CM

## 2014-05-25 DIAGNOSIS — N838 Other noninflammatory disorders of ovary, fallopian tube and broad ligament: Secondary | ICD-10-CM

## 2014-05-25 DIAGNOSIS — N83209 Unspecified ovarian cyst, unspecified side: Secondary | ICD-10-CM

## 2014-05-25 DIAGNOSIS — D72829 Elevated white blood cell count, unspecified: Secondary | ICD-10-CM

## 2014-05-25 LAB — CBC
HEMATOCRIT: 43.7 % (ref 36.0–46.0)
HEMOGLOBIN: 14.8 g/dL (ref 12.0–15.0)
MCH: 30.9 pg (ref 26.0–34.0)
MCHC: 33.9 g/dL (ref 30.0–36.0)
MCV: 91.2 fL (ref 78.0–100.0)
Platelets: 443 10*3/uL — ABNORMAL HIGH (ref 150–400)
RBC: 4.79 MIL/uL (ref 3.87–5.11)
RDW: 12.9 % (ref 11.5–15.5)
WBC: 7.1 10*3/uL (ref 4.0–10.5)

## 2014-05-25 MED ORDER — ALPRAZOLAM 0.5 MG PO TABS: 0.5000 mg | ORAL_TABLET | Freq: Every evening | ORAL | Status: DC | PRN

## 2014-05-25 NOTE — Progress Notes (Signed)
68 y.o.Marriedfemale here for a pelvic ultrasound after reporting last Friday at there annual exam some new issues with bloating and abdominal pain.  Pt continues to feel worse right after she eats.  Feels bloated and full then.  Also having some mild to moderate discomfort.  Taking Tramadol or Advil with success.  I started her on Cipro and flagyl last week as her WBC ct was 10.3 and she was diffusely tender throughout her abdomen.  Also pelvic exam did not reveal clear mass.  She is feeling some better.  She did her her GI this week who does not think the history of microscopic colitis has much bearing.  I asked her to be seen just for completeness sake.  Denies VB.  Pt has been reading and is anxious, of course.      Patient's last menstrual period was 02/05/2009.  Sexually active:  yes  Contraception: PMP state  FINDINGS: UTERUS: 5.4 x 3.4 x 2.5cm  EMS: 3.3mm ADNEXA:   Left ovary 4.2 x 4.6 x 3.2cm with solid 35 x 27 x 97mm mass.  RI 0.65   Right ovary  4.6 x 3.5 x 3.1cm with 3.6 x 3.2cm mixed cystic mass.  Septations present. Largest septation thickness is 1.12mm.  Two small areas of calcifications noted.   + doppler flow.  RI 0.53-0.56 CUL DE SAC: no free fluid seen  D/w pt and her husband ultrasound images and reviewed again CT scan.  Ovary is concerning for neoplasia however there is no evidence of ascites or upper abdomen disease on CT scan.  Also, CT states "adnexal cystic changes with inflammation".  Aware she needs to proceed with hysterectomy.  Also aware there are signs that point to malignancy but some that don't.  Will obtained a ca-125 and refer.  Specifically asked if pt thought she might need something for anxiety and she said yes.  Assessment:  Bilateral adnexal masses <5cm, solid/cystic on right, solid on left, inflammatory changes on CT  Plan: Ca-125 pending Repeat CBC Complete 10 days of antibiotics Will refer to gyn oncology, probably to [facility]  ~30 minutes spent with  patient >50% of time was in face to face discussion of above.

## 2014-05-26 LAB — CA 125: CA 125: 262.7 U/mL — AB (ref 0.0–30.2)

## 2014-05-26 NOTE — Telephone Encounter (Signed)
Spoke with Donah Driver new patient co-oordinator. Appt with dr Arletta Bale scheduled for Tuesday 05-30-14 at 315.  Will fax new patient packet to our office since not enough time to mail to patient. OV notes, labs and demographics faxed to [facility]. Sheery requests CD of CT scan be mailed from [facility].    Spoke to [employer], they ned written request from [facility] or patient [facility].  Since appt is Tuesday they cannot get it sent there by then and they suggest patient can pick up if desires.  They will have CD ready for her on Monday, she should go to the location where CT was performed.

## 2014-05-26 NOTE — Telephone Encounter (Signed)
Pt calling to schedule surgery.

## 2014-05-29 ENCOUNTER — Telehealth: Payer: Self-pay | Admitting: *Deleted

## 2014-05-29 NOTE — Telephone Encounter (Signed)
Call to patient, LMTCB on cell number. No answer and no VM on home number. Calling patient to give new patient package from [facility] (since they did not have time to mail) and discuss picking up CD of CT scan.

## 2014-05-29 NOTE — Telephone Encounter (Signed)
Spoke with pt personally on Friday, 6/19.  Aware Ca-125 is elevated.  She has appt on 6/23 at [facility].  All questions answered.  Pt aware I am here for anything she might need.  She was very appreciative of my phone call.  Encounter closed.

## 2014-05-29 NOTE — Telephone Encounter (Signed)
Returning a call to Sally. °

## 2014-05-29 NOTE — Telephone Encounter (Signed)
Call to patient and advised that I have map and patient forms for her to prepare for appointment at [place] if she would like to pick them up. Also asked if she would like to pick up CD of her CT scan since it may not get there prior to appointment in the mail. Patient will pick up both the new patient packet (here) and her CD at [place].   Routing to provider for final review. Patient agreeable to disposition. Will close encounter

## 2014-06-01 ENCOUNTER — Encounter: Payer: Self-pay | Admitting: Obstetrics & Gynecology

## 2014-06-05 DIAGNOSIS — C569 Malignant neoplasm of unspecified ovary: Secondary | ICD-10-CM | POA: Insufficient documentation

## 2014-06-28 ENCOUNTER — Telehealth: Payer: Self-pay | Admitting: Obstetrics & Gynecology

## 2014-06-28 NOTE — Telephone Encounter (Signed)
Spoke with patient and advised that Dr. Sabra Heck did receive her records from [facility] and has reviewed them. Patient states "I am doing really well and I am so thankful for all Dr. Sabra Heck has done for me, I just wanted to make sure she is updated."  Advised patient that Dr. Sabra Heck is out of office this week, but that I would let her know I spoke with her.  Routing to provider for final review. Patient agreeable to disposition. Will close encounter

## 2014-06-28 NOTE — Telephone Encounter (Signed)
Patient wants to know if her records were received from [facility].

## 2014-08-31 ENCOUNTER — Other Ambulatory Visit: Payer: Self-pay

## 2014-08-31 NOTE — Telephone Encounter (Signed)
Last AEX:05/19/14 Last refill:05/25/14 #30 X 0 Current AEX:06/01/15  Please advise

## 2014-09-01 MED ORDER — ALPRAZOLAM 0.5 MG PO TABS: 0.5000 mg | ORAL_TABLET | Freq: Every evening | ORAL | Status: DC | PRN

## 2014-09-01 NOTE — Telephone Encounter (Signed)
Rx faxed to [facility] 

## 2014-10-09 ENCOUNTER — Encounter: Payer: Self-pay | Admitting: Obstetrics & Gynecology

## 2014-11-23 ENCOUNTER — Encounter: Payer: Self-pay | Admitting: Obstetrics & Gynecology

## 2015-01-15 ENCOUNTER — Telehealth: Payer: Self-pay | Admitting: Obstetrics & Gynecology

## 2015-01-15 NOTE — Telephone Encounter (Signed)
Spoke with patient. Patient states she is currently using estradiol patches weekly. "At first it started off as vivelle dot then when I got it filled it was just an estradiol patch which I know are the same thing. Well now I picked it up and it is something called alora. I do not know if this is something that Dr.Miller is doing or the pharmacy." Advised patient the change is pharmacy related. Advised may be related to the comparable generic they have in stock at the time. Advised we can place a note in the prescription to have to be dispensed as written if she would prefer so that they do not make the changes. Patient is agreeable. "I just want to see what Dr.Miller thinks about this alora. If she says that it is fine then I don't mind." Advised patient I will check with Dr.Miller and return call with further recommendations. Patient is agreeable.

## 2015-01-15 NOTE — Telephone Encounter (Signed)
Patient calling requesting to speak with the nurse about her estrogen patch.

## 2015-01-16 NOTE — Telephone Encounter (Signed)
Yes, this is all pharmacy change and not mine.  Alora is just a generic estradiol.  It should be less expensive.  If she is not have any problems, it is fine to stay on the Alora.  Thanks.  Can close encounter once pt aware unless she has additional questions.

## 2015-01-17 MED ORDER — ESTRADIOL 0.025 MG/24HR TD PTTW: 1.0000 | MEDICATED_PATCH | TRANSDERMAL | Status: DC

## 2015-01-17 NOTE — Telephone Encounter (Signed)
Patient is waiting for update regarding prescription.

## 2015-01-17 NOTE — Telephone Encounter (Signed)
Spoke with patient. Advised patient of message as seen below from [place]. Patient is agreeable. Patient would like to keep taking the alora at this time. "I have not noticed any problems with it. So if Dr.Miller says it is okay I'll keep taking it." [facility]. Prescription for estradiol patches #8 4RF transferred with note to dispense as alora per patient request as she does not want to switch to another generic form.  Routing to provider for final review. Patient agreeable to disposition. Will close encounter

## 2015-01-23 ENCOUNTER — Telehealth: Payer: Self-pay | Admitting: Obstetrics & Gynecology

## 2015-01-23 NOTE — Telephone Encounter (Signed)
Spoke with patient. Patient states that she received a letter from her insurance company stating that she needs PA for her Alora patches. Advised patient will call insurance company to initiate PA and will return call to patient once we have heard back which can take 48-72 hours. Patient is agreeable.

## 2015-01-23 NOTE — Telephone Encounter (Signed)
Signed and on your desk.  Thank you.

## 2015-01-23 NOTE — Telephone Encounter (Signed)
Patient calling requesting to speak with the nurse about her Rx for Alora.

## 2015-01-23 NOTE — Telephone Encounter (Signed)
PA to Dr.Miller for review and signature for fax.

## 2015-01-23 NOTE — Telephone Encounter (Signed)
Spoke with Tiffany at [employer]. PA initiated for Candace Manning 0.025mg  patches. Awaiting fax to fill out form for MD review and signature.

## 2015-01-24 NOTE — Telephone Encounter (Signed)
Prior authorization faxed to [facility] at [phone] with cover sheet and confirmation.

## 2015-01-25 NOTE — Telephone Encounter (Signed)
Left message to call Dazia Lippold at [phone]. 

## 2015-01-26 MED ORDER — ESTRADIOL 0.5 MG PO TABS: ORAL_TABLET | ORAL | Status: DC

## 2015-01-26 NOTE — Telephone Encounter (Signed)
Spoke with patient. Advised prior authorization for Alora has been denied. Discussed with Dr.Miller who recommends that she begin taking Estradiol .5mg  1/2 tablet daily. Patient is agreeable. Requesting 3 month supply. Estradiol .5mg  #45 1RF sent to [facility] off [address]. Patient will pay cash pay for prescription.   Routing to provider for final review. Patient agreeable to disposition. Will close encounter

## 2015-04-02 ENCOUNTER — Other Ambulatory Visit: Payer: Self-pay

## 2015-04-02 DIAGNOSIS — Z1231 Encounter for screening mammogram for malignant neoplasm of breast: Secondary | ICD-10-CM

## 2015-04-25 ENCOUNTER — Ambulatory Visit: Payer: Self-pay

## 2015-04-26 ENCOUNTER — Ambulatory Visit
Admission: RE | Admit: 2015-04-26 | Discharge: 2015-04-26 | Disposition: A | Discharge status: Home / Self Care | Payer: Medicare Other | Source: Ambulatory Visit

## 2015-04-26 DIAGNOSIS — Z1231 Encounter for screening mammogram for malignant neoplasm of breast: Secondary | ICD-10-CM

## 2015-06-01 ENCOUNTER — Ambulatory Visit (INDEPENDENT_AMBULATORY_CARE_PROVIDER_SITE_OTHER): Payer: Medicare Other | Admitting: Obstetrics & Gynecology

## 2015-06-01 ENCOUNTER — Encounter: Payer: Self-pay | Admitting: Obstetrics & Gynecology

## 2015-06-01 VITALS — BP 124/76 | HR 80 | Resp 20 | Ht 63.75 in | Wt 126.0 lb

## 2015-06-01 DIAGNOSIS — IMO0002 Reserved for concepts with insufficient information to code with codable children: Secondary | ICD-10-CM

## 2015-06-01 DIAGNOSIS — C569 Malignant neoplasm of unspecified ovary: Secondary | ICD-10-CM | POA: Diagnosis not present

## 2015-06-01 DIAGNOSIS — Z0000 Encounter for general adult medical examination without abnormal findings: Secondary | ICD-10-CM | POA: Diagnosis not present

## 2015-06-01 DIAGNOSIS — Z124 Encounter for screening for malignant neoplasm of cervix: Secondary | ICD-10-CM

## 2015-06-01 DIAGNOSIS — N941 Dyspareunia: Secondary | ICD-10-CM | POA: Diagnosis not present

## 2015-06-01 DIAGNOSIS — Z01419 Encounter for gynecological examination (general) (routine) without abnormal findings: Secondary | ICD-10-CM

## 2015-06-01 LAB — POCT URINALYSIS DIPSTICK
BILIRUBIN UA: NEGATIVE
GLUCOSE UA: NEGATIVE
Ketones, UA: NEGATIVE
LEUKOCYTES UA: NEGATIVE
NITRITE UA: NEGATIVE
Protein, UA: NEGATIVE
RBC UA: NEGATIVE
UROBILINOGEN UA: NEGATIVE
pH, UA: 5

## 2015-06-01 LAB — HEMOGLOBIN, FINGERSTICK: Hemoglobin, fingerstick: 13.9 g/dL (ref 12.0–16.0)

## 2015-06-01 MED ORDER — FLUOXETINE HCL 20 MG PO CAPS: 20.0000 mg | ORAL_CAPSULE | Freq: Two times a day (BID) | ORAL | Status: DC

## 2015-06-01 NOTE — Patient Instructions (Signed)
[email or web address]

## 2015-06-01 NOTE — Progress Notes (Signed)
69 y.o. G1P1 MarriedCaucasianF here for annual exam.  Doing well.  Had robotic TLH/BSO/LND/ileo-cecal resection with reanastamosis 6/15 after diagnosis of ovarian cancer.  Pt seen with GI related symptoms but evaluation revealed this finding.  No vaginal bleeding.  Pt seeing Dr. Wendie Agreste every three months.  Finished chemo in November 24th, 2015.  Still has port in place.  Has ca-125 at every visit.  Has been low.  Not having CT scans unless issues/problems.  Also on trial using Tamoxifen.  Still taking 0.25mg  Estradiol.  D/w probably not doing her much good.  D/W pt stopping.   Reports she feels she is doing well.  Having some occasional RUQ pain but upon showing to me is actually more like rib pain. Doing exercises with a lot of stretching of upper body.  Emotionally feels she has done well except in few weeks up to last appt.  Was beginning to plan own funeral.  Feels seeing a therapist might help.  Long term use of Prozac 20mg .  Dosage has never change.  Finally, reveals she is having a lot of dyspareunia.  Feels dry.  Has used some lubricants and moisturizers without much success.    PCP:  Dr. Addison Lank.  Last app within the last six months.  Screening labs done.   Patient's last menstrual period was 02/05/2009.          Sexually active: Yes.    The current method of family planning is vasectomy & hysterectomy.    Exercising: Yes.    treadmill,machines [employer] Smoker:  no  Health Maintenance: Pap: 05-19-14 neg History of abnormal Pap:  no MMG:  04-26-15 category c density birads 1:neg Colonoscopy:  07/17/11 Dr. Wynetta Emery BMD:   08/27/12, 1.3/-1.3 TDaP:  11/09 Screening Labs: none today, Hb today: 13.9, Urine today: neg Self breast exam: done occ   reports that she has never smoked. She has never used smokeless tobacco. She reports that she drinks about 1.8 - 2.4 oz of alcohol per week. She reports that she does not use illicit drugs.  Past Medical History  Diagnosis Date  . Heart murmur   .  Mitral valve prolapse   . Bursitis   . Headache(784.0)     migraines with dizziness  . Osteopenia   . Shingles 03/2010  . Depression   . Elevated cholesterol   . Hiatal hernia     Dr. Collene Mares  . Colitis     microscopic, Dr. Wynetta Emery  . GERD (gastroesophageal reflux disease)   . Cancer     Past Surgical History  Procedure Laterality Date  . Tonsillectomy  1954  . Nasal septum surgery  1979  . Breast biopsy  09/1999    left breast-benign  . Abdominal hysterectomy      Current Outpatient Prescriptions  Medication Sig Dispense Refill  . ALPRAZolam (XANAX) 0.5 MG tablet Take 1 tablet (0.5 mg total) by mouth at bedtime as needed for anxiety. 30 tablet 0  . Biotin 1000 MCG tablet Take 1,000 mcg by mouth daily.    . calcium citrate-vitamin D (CITRACAL+D) 315-200 MG-UNIT per tablet     . Cholecalciferol (VITAMIN D PO) Take 4,000 Int'l Units by mouth daily.     Marland Kitchen esomeprazole (NEXIUM) 40 MG capsule Take 40 mg by mouth daily before breakfast.    . estradiol (ESTRACE) 0.5 MG tablet Take half a tablet once per day. 45 tablet 1  . FLUoxetine (PROZAC) 20 MG capsule Take 20 mg by mouth daily.    Marland Kitchen  ibuprofen (ADVIL,MOTRIN) 600 MG tablet Take 600 mg by mouth.    . latanoprost (XALATAN) 0.005 % ophthalmic solution 1 drop at bedtime.    Marland Kitchen levocetirizine (XYZAL) 5 MG tablet Take 5 mg by mouth every evening.  5  . Magnesium 250 MG TABS Take by mouth daily.    . Multiple Vitamins-Minerals (MULTIVITAMIN PO) Take by mouth.    . NON FORMULARY Allergy injections-one in each are weekly    . Omega-3 Fatty Acids (FISH OIL PO) Take 4,000 mg by mouth daily.    . Probiotic Product (PROBIOTIC DAILY PO) Take by mouth. Ultra flora B daily    . risedronate (ACTONEL) 150 MG tablet Take 150 mg by mouth every 30 (thirty) days.    . rosuvastatin (CRESTOR) 20 MG tablet Take 20 mg by mouth daily.    . sodium fluoride-calcium carbonate (FLORICAL) 8.3-364 MG CAPS Take 1 capsule by mouth 2 (two) times daily.    .  tamoxifen (NOLVADEX) 20 MG tablet Take 20 mg by mouth daily.    . TURMERIC CURCUMIN PO by Does not apply route daily.    . vitamin E (VITAMIN E) 400 UNIT capsule Take 400 Units by mouth daily.    . diclofenac (FLECTOR) 1.3 % PTCH Place 1 patch onto the skin as needed.    Marland Kitchen EPINEPHrine (EPIPEN 2-PAK) 0.3 mg/0.3 mL IJ SOAJ injection as needed.     No current facility-administered medications for this visit.    Family History  Problem Relation Age of Onset  . Cancer Mother 34    ovarian  . Cancer Maternal Grandmother     breast  . Cancer Maternal Aunt     breast  . Diabetes Maternal Grandfather   . Cancer Paternal Aunt     breast  . Hypertension Father   . Aneurysm Father     brain    ROS:  Pertinent items are noted in HPI.  Otherwise, a comprehensive ROS was negative.  Exam:   BP 124/76 mmHg  Pulse 80  Resp 20  Ht 5' 3.75" (1.619 m)  Wt 126 lb (57.153 kg)  BMI 21.80 kg/m2  LMP 02/05/2009  Weight change: -7#   Height: 5' 3.75" (161.9 cm)  Ht Readings from Last 3 Encounters:  06/01/15 5' 3.75" (1.619 m)  05/25/14 5\' 4"  (1.626 m)  05/19/14 5\' 4"  (1.626 m)    General appearance: alert, cooperative and appears stated age Head: Normocephalic, without obvious abnormality, atraumatic Neck: no adenopathy, supple, symmetrical, trachea midline and thyroid normal to inspection and palpation Lungs: clear to auscultation bilaterally Breasts: normal appearance, no masses or tenderness Heart: regular rate and rhythm Abdomen: soft, non-tender; bowel sounds normal; no masses,  no organomegaly Extremities: extremities normal, atraumatic, no cyanosis or edema Skin: Skin color, texture, turgor normal. No rashes or lesions Lymph nodes: Cervical, supraclavicular, and axillary nodes normal. No abnormal inguinal nodes palpated Neurologic: Grossly normal   Pelvic: External genitalia:  no lesions              Urethra:  normal appearing urethra with no masses, tenderness or lesions               Bartholins and Skenes: normal                 Vagina: normal appearing vagina with normal color and discharge, no lesions              Cervix: absent  Pap taken: Yes.   Bimanual Exam:  Uterus:  uterus absent              Adnexa: no mass, fullness, tenderness               Rectovaginal: Confirms               Anus:  normal sphincter tone, no lesions  Chaperone was present for exam.  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  Completed therapy 11/16.  On Tamoxifen trial to decrease recurrence risk.  Reminded of DVT risk. PMP, on low dose HRT.  Osteopenia Dysparuenia Depression hx with worsening anxiety due to diagnosis Mildly elevated lipids Family hx of breast and ovarian cancer  P: Mammogram yearly. D/W pt doing 3D MMG. pap smear only today.  Screening labs with Dr. Addison Lank Will stop estradiol.  Pt will call if she has any new symptoms Trial of 40mg  of Prozac.  #30/13 Name for therapist given Instructions for Coconut oil use as well as lubricants and dilators.  Website for purchase provided.  Pt may need to return for instructions. Consider genetic counseling return annually or prn  About 15 minutes in addition to above discussion spent on topic of dyspareunia and strategies for improvement.

## 2015-06-03 DIAGNOSIS — IMO0002 Reserved for concepts with insufficient information to code with codable children: Secondary | ICD-10-CM | POA: Insufficient documentation

## 2015-06-04 ENCOUNTER — Other Ambulatory Visit: Payer: Self-pay

## 2015-06-05 LAB — IPS PAP SMEAR ONLY

## 2015-06-12 ENCOUNTER — Telehealth: Payer: Self-pay

## 2015-06-12 NOTE — Telephone Encounter (Signed)
-----   Message from Megan Salon, MD sent at 06/06/2015  3:26 PM EDT ----- 02 recall.  Can you please call her?  We talked about genetic testing at her OV but then we changed subjects.  Does she want me to refer her for genetic testing?  I think it is a good idea as her mother had ovarian cancer as well.  Thanks.

## 2015-06-12 NOTE — Telephone Encounter (Signed)
Lmtcb//kn 

## 2015-06-12 NOTE — Telephone Encounter (Signed)
Patient is returning a call to Kelly °

## 2015-06-19 NOTE — Telephone Encounter (Signed)
Patient returning call.

## 2015-06-21 NOTE — Telephone Encounter (Signed)
Returned call

## 2015-06-22 NOTE — Telephone Encounter (Signed)
Lmtcb//kn 

## 2015-07-26 ENCOUNTER — Encounter: Payer: Self-pay | Admitting: Obstetrics & Gynecology

## 2015-07-31 ENCOUNTER — Telehealth: Payer: Self-pay | Admitting: Emergency Medicine

## 2015-07-31 DIAGNOSIS — C569 Malignant neoplasm of unspecified ovary: Secondary | ICD-10-CM

## 2015-07-31 NOTE — Telephone Encounter (Signed)
Referral placed for genetics.  Left message with scheduler to return my call.

## 2015-07-31 NOTE — Telephone Encounter (Signed)
Patient returned call. Advised referral is pending and will contact her with appointment when I receive it. She is agreeable.

## 2015-07-31 NOTE — Telephone Encounter (Signed)
Call to patient to advise referral placed. Will contact when appointment scheduled.

## 2015-08-02 ENCOUNTER — Telehealth: Payer: Self-pay | Admitting: Genetic Counselor

## 2015-08-02 NOTE — Telephone Encounter (Signed)
Called and left message with genetics for appointment.

## 2015-08-02 NOTE — Telephone Encounter (Signed)
Called pt to schedule gen counseling appt.  Left message.

## 2015-08-03 NOTE — Telephone Encounter (Signed)
Patient was contacted by [facility] and appointment scheduled for 08/14/15.

## 2015-08-03 NOTE — Telephone Encounter (Signed)
Patient returned call and she is aware of appointment for Genetics that is schedule for 08/14/15. Will close encounter.

## 2015-08-03 NOTE — Telephone Encounter (Signed)
Message left to return call to Aldina Porta at [phone].    

## 2015-08-07 NOTE — Telephone Encounter (Signed)
Dr Sabra Heck has called patient personally to discuss all genetic testing questions.//kn

## 2015-08-14 ENCOUNTER — Ambulatory Visit (HOSPITAL_BASED_OUTPATIENT_CLINIC_OR_DEPARTMENT_OTHER): Payer: Medicare Other | Admitting: Genetic Counselor

## 2015-08-14 ENCOUNTER — Encounter: Payer: Self-pay | Admitting: Genetic Counselor

## 2015-08-14 ENCOUNTER — Other Ambulatory Visit: Payer: Medicare Other

## 2015-08-14 DIAGNOSIS — Z803 Family history of malignant neoplasm of breast: Secondary | ICD-10-CM

## 2015-08-14 DIAGNOSIS — Z801 Family history of malignant neoplasm of trachea, bronchus and lung: Secondary | ICD-10-CM | POA: Diagnosis not present

## 2015-08-14 DIAGNOSIS — Z315 Encounter for genetic counseling: Secondary | ICD-10-CM

## 2015-08-14 DIAGNOSIS — C569 Malignant neoplasm of unspecified ovary: Secondary | ICD-10-CM

## 2015-08-14 DIAGNOSIS — Z800 Family history of malignant neoplasm of digestive organs: Secondary | ICD-10-CM

## 2015-08-14 DIAGNOSIS — Z8041 Family history of malignant neoplasm of ovary: Secondary | ICD-10-CM

## 2015-08-14 NOTE — Progress Notes (Signed)
REFERRING PROVIDER: Lyman Speller, MD  PRIMARY PROVIDER:  Cari Caraway, MD  PRIMARY REASON FOR VISIT:  1. Cancer of ovary, unspecified laterality   2. Family history of ovarian cancer   3. Family history of breast cancer in female   29. Family history of colon cancer   5. Family history of lung cancer      HISTORY OF PRESENT ILLNESS:   Candace Manning, a 69 y.o. female, was seen for a [place] cancer genetics consultation at the request of Dr. Sabra Heck due to a personal history of ovarian cancer and family history of ovarian, breast, and other cancers.  Candace Manning previously had negative genetic testing several years ago.  At the time, this was ordered by Dr. Sabra Heck and included BRCA1/2.  She will try to find a copy of that report.  Candace Manning presents to clinic today to discuss the possibility of a hereditary predisposition to cancer, updated genetic testing options, and to further clarify her future cancer risks, as well as potential cancer risks for family members.   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 This was treated with TLH/BSO/LND/ileo-cecal reseciton, reanastomosis, chemotherapy, and tamoxifen.     CANCER HISTORY:   No history exists.     HORMONAL RISK FACTORS:  Menarche was at age 54.  First live birth at age 36.  OCP use for approximately 1 years.  Ovaries intact: no.  Hysterectomy: yes.  Menopausal status: postmenopausal.  HRT use: combined estrogen and progesterone with some testosterone for approximately 5 years. Colonoscopy: yes; normal. Mammogram within the last year: yes. Number of breast biopsies: 1. Up to date with pelvic exams:  yes. Any excessive radiation exposure in the past:  no  Past Medical History  Diagnosis Date  . Heart murmur   . Mitral valve prolapse   . Bursitis   . Headache(784.0)     migraines with dizziness  . Osteopenia   . Shingles 03/2010  . Depression    . Elevated cholesterol   . Hiatal hernia     Dr. Collene Mares  . Colitis     microscopic, Dr. Wynetta Emery  . GERD (gastroesophageal reflux disease)   . Ovarian cancer 6/15    neg genetic testing    Past Surgical History  Procedure Laterality Date  . Tonsillectomy  1954  . Nasal septum surgery  1979  . Breast biopsy  09/1999    left breast-benign  . Abdominal hysterectomy      Social History   Social History  . Marital Status: Married    Spouse Name: N/A  . Number of Children: N/A  . Years of Education: N/A   Social History Main Topics  . Smoking status: Former Smoker -- 0.00 packs/day    Types: Cigarettes  . Smokeless tobacco: Never Used     Comment: smoked socially in college--very occ  . Alcohol Use: 1.8 - 2.4 oz/week    3-4 Standard drinks or equivalent per week     Comment: wine  . Drug Use: No  . Sexual Activity:    Partners: Male     Comment: husband vasectomy   Other Topics Concern  . None   Social History Narrative     FAMILY HISTORY:  We obtained a detailed, 4-generation family history.  Significant diagnoses are listed below: Family History  Problem Relation Age of Onset  . Ovarian cancer Mother 67  . Breast cancer Maternal Grandmother  dx. 101 or younger  . Colon cancer Maternal Aunt     dx. 97s  . Diabetes Maternal Grandfather   . Stroke Maternal Grandfather     +EtOH  . Breast cancer Paternal Aunt     dx. 24 or younger  . Hypertension Father   . Stroke Father   . Other Sister     hx of TAH-BSO  . Colon cancer Maternal Uncle     dx. >50  . Stroke Maternal Uncle   . Depression Paternal Uncle   . Stroke Paternal Grandmother   . Depression Paternal Grandfather   . Breast cancer Maternal Aunt     dx. <50  . Breast cancer Maternal Aunt     dx. >50  . Lung cancer Cousin     dx. <50; smoker  . Stroke Paternal Aunt     Candace Manning has one daughter, age 28, who has never had cancer.  She has two grandsons, ages 25 years and 3 months.  She has  one full sister who is currently 66 and cancer-free; this sister has a history of a TAH-BSO.  Candace Manning mother died of ovarian cancer; she was diagnosed at 78.  Candace Manning father died of a stroke at 59.    There is a maternal family history of breast and colon cancer.  Candace Manning mother had three full sisters and one full brother.  Her brother died of a stroke in his late 14s, but had a history of colon cancer diagnosed in his 53s-60s.  He had one son and one daughter; his son was diagnosed with lung cancer below the age of 75 and was a smoker.  One of Candace Manning maternal aunts also had colon cancer in her 8s.  She is currently 28.  Neither her son or daughter have had cancer.  The remaining two maternal aunts were diagnosed with breast cancer--one at an age less than 46, the other at an age older than 8.  To Candace Manning knowledge, no additional cousins have had cancer.  Candace Manning maternal grandmother was diagnosed with breast cancer and passed away at 52.  She has no information for her MGM's siblings or parents.  Her maternal grandfather died of a stroke in his late 39s-early 80s.  Candace Manning father had two full sisters and one full brother.  One sister was diagnosed with breast cancer at 69 or younger and died at 59.  She had one daughter who has not had cancer.  Another paternal aunt died of a stroke in her late 64s.  The paternal uncle died in his late 73s-80s, but did not have cancer.  Candace Manning paternal grandmother died of a stoke at 62.  Her paternal grandfather died of TB in his early 66s.  She is unaware of any additional cancer diagnoses in the family  Patient's maternal and paternal ancestors are of English descent. There is no reported Ashkenazi Jewish ancestry. There is no known consanguinity.  GENETIC COUNSELING ASSESSMENT: Candace Manning is a 69 y.o. female with a personal and family history of cancer which is  somewhat suggestive of a hereditary breast/ovarian  cancer syndrome and predisposition to cancer. We, therefore, discussed and recommended the following at today's visit.   DISCUSSION: We reviewed the characteristics, features and inheritance patterns of hereditary cancer syndromes, particularly those caused by mutations within the BRCA1/2 genes, Lynch syndrome genes, and several new genes associated with an increased risk for breast and/or ovarian cancer. We also discussed  genetic testing, including the appropriate family members to test, the process of testing, insurance coverage and turn-around-time for results. We discussed the implications of a negative, positive and/or variant of uncertain significant result. We recommended Candace Manning pursue genetic testing for the 24-gene OvaNext Panel through [facility] [employer] [facility] [facility] ([place], [place]).  The OvaNext Panel offered by [employer] includes sequencing and deletion/duplication analysis for the following 23 genes: ATM, BARD1, BRCA1, BRCA2, BRIP1, CDH1, CHEK2, MLH1, MRE11A, MSH2, MSH6, MUTYH, NBN, NF1, PALB2, PMS2, PTEN, RAD50, RAD51C, RAD51D, SMARCA4, STK11, and TP53.  This panel also includes deletion/duplication analysis (without sequencing) for one gene, EPCAM.  Based on Candace Manning personal and family history of cancer, she meets medical criteria for genetic testing. Despite that she meets criteria, she may still have an out of pocket cost. We discussed that if her out of pocket cost for testing is over $100, the laboratory will call and confirm whether she wants to proceed with testing.  If the out of pocket cost of testing is less than $100 she will be billed by the genetic testing laboratory.   Based on the patient's personal and family history, a statistical model (the Tyrer-Cuzick risk model)  and literature data were used to estimate her risk of developing breast cancer. These estimate her lifetime risk of developing breast cancer to be approximately 11.7%. This estimation does not take into  account any genetic testing results.  The patient's lifetime breast cancer risk is a preliminary estimate based on available information using one of several models endorsed by the [place] (ACS). The ACS recommends consideration of breast MRI screening as an adjunct to mammography for patients at high risk (defined as 20% or greater lifetime risk). A more detailed breast cancer risk assessment can be considered, if clinically indicated.   PLAN: After considering the risks, benefits, and limitations, Candace Manning  provided informed consent to pursue genetic testing and the blood sample was sent to [facility] for analysis of the 24-gene OvaNext Panel test. Results should be available within approximately 3-4 weeks' time, at which point they will be disclosed by telephone to Ms. Sherburne, as will any additional recommendations warranted by these results. Ms. Brett will receive a summary of her genetic counseling visit and a copy of her results once available. This information will also be available in Epic. We encouraged Ms. Bartosiewicz to remain in contact with cancer genetics annually so that we can continuously update the family history and inform her of any changes in cancer genetics and testing that may be of benefit for her family. Ms. Reynolds questions were answered to her satisfaction today. Our contact information was provided should additional questions or concerns arise.  Thank you for the referral and allowing [place] to share in the care of your patient.   Jeanine Luz, MS Genetic Counselor [email or web address] Phone: [phone]  The patient was seen for a total of 55 minutes in face-to-face genetic counseling.  This patient was discussed with Drs. Magrinat, Lindi Adie and/or Burr Medico who agrees with the above.    _______________________________________________________________________ For Office Staff:  Number of people involved in session: 1 Was an Intern/ student  involved with case: no

## 2015-08-15 ENCOUNTER — Telehealth: Payer: Self-pay | Admitting: Genetic Counselor

## 2015-08-15 NOTE — Telephone Encounter (Signed)
Ms. Biggins called to give me some updated information for her family history that she reported yesterday at her genetic counseling appt.  I made a note of these updates.  She also faxed me a copy of her previous negative genetic test report, which I will fax to [facility] so that they will have that to consider with her test requisition.  The information that Ms. Paar provided for her family today, will not change the testing chosen for her, but is helpful nonetheless.  She is welcome to call me with any further updates or with any questions; otherwise, her results will be back in about 3-4 weeks.

## 2015-09-03 ENCOUNTER — Telehealth: Payer: Self-pay | Admitting: Genetic Counselor

## 2015-09-04 ENCOUNTER — Encounter: Payer: Self-pay | Admitting: Genetic Counselor

## 2015-09-04 ENCOUNTER — Telehealth: Payer: Self-pay | Admitting: Genetic Counselor

## 2015-09-04 DIAGNOSIS — Z1379 Encounter for other screening for genetic and chromosomal anomalies: Secondary | ICD-10-CM | POA: Insufficient documentation

## 2015-09-04 NOTE — Telephone Encounter (Signed)
Revealed negative genetic testing on the OvaNext Panel test.  Discussed that based on PH and FH, we would consider her still at high risk, and that her negative test may just be the consequence of a limitation of our testing ability.  Discussed [facility] research study.  This study is additional genetic testing through research and looks at about 80 genes associated with ovarian cancer.  Results take up to 6 months to get back.  She is interested and I provided Abran Duke (study coordinator)'s information. Test kit will be sent to [place], so that we can put blood tubes in the lab and consolidate her blood draw. We will have her sign the consent form, and send a copy of her pathology report and her genetic test results.

## 2015-09-06 ENCOUNTER — Ambulatory Visit: Payer: Self-pay | Admitting: Genetic Counselor

## 2015-09-06 ENCOUNTER — Telehealth: Payer: Self-pay | Admitting: Genetic Counselor

## 2015-09-06 NOTE — Telephone Encounter (Signed)
Patient called stating that she lost the phone number for the research study contact. Called and provided the contact information for Abran Duke - [email or web address] and [phone].

## 2015-09-17 NOTE — Telephone Encounter (Signed)
LM for Ms. Stivers.  Test results are back, call my office at [phone].

## 2015-09-17 NOTE — Progress Notes (Signed)
GENETIC TEST RESULTS  HPI: Candace Manning was previously seen in the [facility] clinic due to a personal history of ovarian cancer and family history of ovarian, breast, and other cancers, and concerns regarding a hereditary predisposition to cancer. Candace Manning previously had negative BRACAnalysis genetic testing (w/ BRCA1 5-site rearrangement panel, w/o BART) through [facility].  Date of report is January 08, 2011.  She returns to clinic for updated genetic testing. (Please refer to our prior cancer genetics clinic note from August 14, 2015 for more information regarding Candace Manning medical, social and family histories, and our assessment and recommendations, at the time. Candace Manning recent genetic test results were disclosed to her, as were recommendations warranted by these results. These results and recommendations are discussed in more detail below.  GENETIC TEST RESULTS: At the time of Candace Manning visit on 08/14/15, we recommended she pursue genetic testing of the 24-gene OvaNext Panel through [employer] [facility] [facility], [place]).  The OvaNext Panel offered by [employer] includes sequencing and deletion/duplication analysis of the following 23 genes: ATM, BARD1, BRCA1, BRCA2, BRIP1, CDH1, CHEK2, MLH1, MRE11A, MSH2, MSH6, MUTYH, NBN, NF1, PALB2, PMS2, PTEN, RAD50, RAD51C, RAD51D, SMARCA4, STK11, and TP53.  This panel also includes deletion/duplication analysis (without sequencing) for one gene, EPCAM.  Those results are now back, the report date of which is September 03, 2015.  Genetic testing was normal, and did not reveal a deleterious mutation in these genes.  Additionally, no variants of uncertain significance (VUSs) were found.  The test report will be scanned into EPIC and will be located under the Results Review tab in the Pathology>Molecular Pathology section.   We discussed with Candace Manning that since the current genetic testing is not  perfect, it is possible there may be a gene mutation in one of these genes that current testing cannot detect, but that chance is small. We also discussed, that it is possible that another gene that has not yet been discovered, or that we have not yet tested, is responsible for the cancer diagnoses in the family, and it is, therefore, important to remain in touch with cancer genetics in the future so that we can continue to offer Candace Manning the most up to date genetic testing.   CANCER SCREENING RECOMMENDATIONS: Given [name] personal and family histories, we must interpret these negative results with some caution.  Families with features suggestive of hereditary risk for cancer tend to have multiple family members with cancer, diagnoses in multiple generations and diagnoses before the age of 38. Candace Manning family exhibits some of these features. Thus this result may simply reflect our current inability to detect all mutations within these genes or there may be a different gene that has not yet been discovered or tested.   We, therefore, discussed that it is reasonable for Candace Manning to be followed by a high-risk breast cancer clinic; in addition to a yearly mammogram and physical exam by a healthcare provider, she should discuss the usefulness of an annual breast MRI with the high-risk clinic providers. We also discussed that a transvaginal ultrasound and a CA-125 blood test can be considered for ovarian cancer screening, although these tests have not been shown to be effective in detecting this cancer early.  Thus, some women at increased risk of ovarian cancer choose to undergo removal of the ovaries and fallopian tubes. We recommended Candace Manning discuss these options further with her gynecologist, oncology and/or primary providers.  RECOMMENDATIONS FOR  FAMILY MEMBERS: Women in this family might be at some increased risk of developing cancer, over the general population risk, simply due to the  family history of cancer. We recommended women in this family have a yearly mammogram beginning at age 3, or 31 years younger than the earliest onset of cancer, an an annual clinical breast exam, and perform monthly breast self-exams. Women in this family should also have a gynecological exam as recommended by their primary provider. All family members should have a colonoscopy by age 56.  We also discussed [employer] [facility] research study.  This study would include additional genetic testing through a research initiative and looks at about 80 genes associated with ovarian cancer.  Results take up to 6 months to get back.  Candace Manning is interested, so she was provided with Abran Duke (study coordinator)'s information. Test kit will be sent to [place] here, so that we can put blood tubes in the lab and consolidate her blood draw. We will have her sign the consent form, and send a copy of her pathology report and her genetic test results in to the study, if she agrees to participate.  FOLLOW-UP: Lastly, we discussed with Candace Manning that cancer genetics is a rapidly advancing field and it is possible that new genetic tests will be appropriate for her and/or her family members in the future. We encouraged her to remain in contact with cancer genetics on an annual basis so we can update her personal and family histories and let her know of advances in cancer genetics that may benefit this family.   Our contact number was provided. Candace Manning questions were answered to her satisfaction, and she knows she is welcome to call us at anytime with additional questions or concerns.   Jeanine Luz, MS Genetic Counselor [email or web address] Phone: [phone]

## 2015-09-21 ENCOUNTER — Telehealth: Payer: Self-pay | Admitting: Genetic Counselor

## 2015-09-21 NOTE — Telephone Encounter (Signed)
Candace Manning had expressed that she would like to participate in the 'Genetic Analysis of Breast, Ovarian, and Related Cancers' Study at the [place] [facility] [place].  She reached out to Candace Manning, study coordinator, at [facility], and Candace Manning sent me a blood draw kit and the consent form, questionnaire, etc.  I called Candace Manning to set up a date and time for her to come to the [facility] for a blood draw.  She would like to come on Wednesday, October 26th.  Thus, she will have her blood drawn at 11:15 AM that day, but will meet me in the [facility] at about 10:30 to go through test consent, etc. Prior to that appointment.

## 2015-10-03 ENCOUNTER — Other Ambulatory Visit: Payer: Medicare Other

## 2015-10-11 ENCOUNTER — Telehealth: Payer: Self-pay | Admitting: Obstetrics & Gynecology

## 2015-10-11 NOTE — Telephone Encounter (Signed)
Patient wants to speak with the nurse. She has some questions no information given.

## 2015-10-12 NOTE — Telephone Encounter (Signed)
Spoke with patient. Patient states that when she was seen in June with Dr.Miller she increased her Prozac to 40 mg daily. Has been seeing [facility]. Was seen yesterday and advised it may be beneficial to add Wellbutrin to her daily regimen as patient states she is having a hard time in the morning. Patient is calling to see if Dr.Miller would be able to prescribe this for her. Advised I will speak with Dr.Miller and return call with additional recommendations. Patient is agreeable.

## 2015-10-12 NOTE — Telephone Encounter (Signed)
Left message to call Kaitlyn at [phone]. 

## 2015-10-12 NOTE — Telephone Encounter (Signed)
Patient returning Kaitlyn's call she asked if she could call her at her home # [phone].

## 2015-10-12 NOTE — Telephone Encounter (Signed)
Spoke with patient. Advised of message as seen below from [place]. Patient would like to review additional suggestions with Dr.Makinson and return call if she would like to try anything additional.   Routing to provider for final review. Patient agreeable to disposition. Will close encounter.

## 2015-10-12 NOTE — Telephone Encounter (Signed)
Wellbutrin can decrease effectiveness of Tamoxifen (which she is taking for her hx of ovarian cancer).  I don't think she will want to take it for that reason.    Could increase prozac to 60mg  daily--40mg  in AM and 20mg  in PM.  Max dosage is 80 mg/day.    Or she could check back with Dr. Gevena Cotton about additional suggestions.    Ok to send rx for Prozac if pt desires.  Should be written as 20mg  dosage and rx for 2 in Am and 1 in PM.  #90 would be one month supply.

## 2015-11-10 ENCOUNTER — Encounter (HOSPITAL_COMMUNITY): Payer: Self-pay

## 2016-01-31 ENCOUNTER — Other Ambulatory Visit: Payer: Self-pay

## 2016-01-31 DIAGNOSIS — F329 Major depressive disorder, single episode, unspecified: Secondary | ICD-10-CM

## 2016-01-31 DIAGNOSIS — F32A Depression, unspecified: Secondary | ICD-10-CM

## 2016-01-31 NOTE — Telephone Encounter (Signed)
Can you please confirm with pt exactly how she is taking the Prozac before I do the RF?  Is she taking 40mg  a day, 20mg  BID (and why BID).  Was planning on seeing her therapist with last phone note but there's nothing since that note.  I just want to write the refill correctly.  Thanks.

## 2016-01-31 NOTE — Telephone Encounter (Signed)
Received a fax from [facility] requesting a 90 day supply for Fluoxetine HCL 20mg . Pt had Rx last filled on 06/01/15.   Last AEX:  06/01/15 Next AEX: 08/29/16  Please approve or deny Rx. Thank you.

## 2016-02-01 MED ORDER — FLUOXETINE HCL 20 MG PO CAPS: 20.0000 mg | ORAL_CAPSULE | Freq: Two times a day (BID) | ORAL | Status: DC

## 2016-02-01 NOTE — Telephone Encounter (Signed)
Rx done for 20mg  bid with 30 day supply only.  Thanks.  Ok to close encounter.

## 2016-02-01 NOTE — Telephone Encounter (Signed)
Called pt, LVM to return call. Will update once pt calls back to get clarification on Rx.

## 2016-02-01 NOTE — Telephone Encounter (Signed)
Spoke with patient, she never made the request to change her prescription to a 90 day supply. She did state that she is taking 20mg  BID per your instructions. Pt stated that she doesn't want a 90 day supply unless she is going to save money in the process, that 30 days is fine. [facility] called [facility] and spoke with Maudie Mercury. She was unsure as to why there had been a request for 90 days. She did mention that her current prescription is expired and that if we send in a script for a 90 day supply, the patient could choose wether or not to fill it monthly or every 3 months. I have called the patient to make her aware of this but with no answer. I LVM to return my call to let her know that with a 90 day supply, she would only pay her copay 3 times instead of monthly and she could choose how she wants the medication will be filled.   Would you like to proceed with the 90 day supply request? Please advise. Thank you.

## 2016-02-04 ENCOUNTER — Telehealth: Payer: Self-pay | Admitting: Obstetrics & Gynecology

## 2016-02-04 NOTE — Telephone Encounter (Signed)
Patient said she is returning a call to [facility] regarding her prescription?

## 2016-02-05 NOTE — Telephone Encounter (Signed)
Spoke with pt to clarify medication that we sent in. I explained what the pharmacy told me. Pt was agreeable to keeping the Rx at a 30 day supply. Pt had no other questions at the time.

## 2016-03-28 ENCOUNTER — Other Ambulatory Visit: Payer: Self-pay

## 2016-03-28 DIAGNOSIS — Z1231 Encounter for screening mammogram for malignant neoplasm of breast: Secondary | ICD-10-CM

## 2016-04-11 ENCOUNTER — Telehealth: Payer: Self-pay | Admitting: Genetic Counselor

## 2016-04-16 NOTE — Telephone Encounter (Signed)
Discussed with Candace Manning that the genetic testing we had ordered through the [place] research study was also negative for any pathogenic mutations that would increase her genetic risks for breast, ovarian, or other related cancers.  So, we still do not have an explanation for the personal and family history of cancer.  Encouraged [name] to still keep in touch with [place] to update the family history and check back on updated genetic testing options in the future.  She has my office number and is also welcome to call with any questions.

## 2016-05-06 ENCOUNTER — Ambulatory Visit
Admission: RE | Admit: 2016-05-06 | Discharge: 2016-05-06 | Disposition: A | Discharge status: Home / Self Care | Payer: Medicare Other | Source: Ambulatory Visit

## 2016-05-06 DIAGNOSIS — Z1231 Encounter for screening mammogram for malignant neoplasm of breast: Secondary | ICD-10-CM

## 2016-05-09 ENCOUNTER — Other Ambulatory Visit: Payer: Self-pay | Admitting: Family Medicine

## 2016-05-09 DIAGNOSIS — R928 Other abnormal and inconclusive findings on diagnostic imaging of breast: Secondary | ICD-10-CM

## 2016-05-16 ENCOUNTER — Ambulatory Visit
Admission: RE | Admit: 2016-05-16 | Discharge: 2016-05-16 | Disposition: A | Discharge status: Home / Self Care | Payer: Medicare Other | Source: Ambulatory Visit | Attending: Family Medicine | Admitting: Family Medicine

## 2016-05-16 DIAGNOSIS — R928 Other abnormal and inconclusive findings on diagnostic imaging of breast: Secondary | ICD-10-CM

## 2016-05-28 ENCOUNTER — Encounter: Payer: Self-pay | Admitting: Obstetrics and Gynecology

## 2016-05-28 ENCOUNTER — Ambulatory Visit (INDEPENDENT_AMBULATORY_CARE_PROVIDER_SITE_OTHER): Payer: Medicare Other | Admitting: Obstetrics and Gynecology

## 2016-05-28 VITALS — BP 118/82 | HR 84 | Resp 14 | Wt 128.0 lb

## 2016-05-28 DIAGNOSIS — L293 Anogenital pruritus, unspecified: Secondary | ICD-10-CM | POA: Diagnosis not present

## 2016-05-28 DIAGNOSIS — K649 Unspecified hemorrhoids: Secondary | ICD-10-CM | POA: Diagnosis not present

## 2016-05-28 DIAGNOSIS — L290 Pruritus ani: Secondary | ICD-10-CM

## 2016-05-28 DIAGNOSIS — K644 Residual hemorrhoidal skin tags: Secondary | ICD-10-CM

## 2016-05-28 MED ORDER — BETAMETHASONE VALERATE 0.1 % EX OINT: 1.0000 "application " | TOPICAL_OINTMENT | Freq: Two times a day (BID) | CUTANEOUS | Status: DC

## 2016-05-28 NOTE — Progress Notes (Signed)
Patient ID: Candace Manning, female   DOB: Nov 16, 1946, 70 y.o.   MRN: [id number] GYNECOLOGY  VISIT   HPI: 70 y.o.   Married  Caucasian  female   G1P1 with Patient's last menstrual period was 02/05/2009.   here c/o vaginal itching. She has tried OTC Monistat with no relief. She has been have perianal itching since March, h/o hemorrhoids. Proctofoam helped. Now her perineum and vulvar pruritus. The itching is worsened at night. The itching has gets a little better and worse. She has tried [employer], last used it last night. No abnormal d/c.  She has a h/o ulcerative colitis, intermittently has explosive diarrhea (4-5 days out of 7 days). She cleans with hypoallergenic wipes. Does sitz baths.   GYNECOLOGIC HISTORY: Patient's last menstrual period was 02/05/2009. Contraception:hysterectomy  Menopausal hormone therapy: none         OB History    Gravida Para Term Preterm AB TAB SAB Ectopic Multiple Living   1 1        1          Patient Active Problem List   Diagnosis Date Noted  . Genetic testing 09/04/2015  . Family history of ovarian cancer 08/14/2015  . Family history of breast cancer in female 08/14/2015  . Family history of colon cancer 08/14/2015  . Dyspareunia 06/03/2015  . Cancer of ovary ([place]) 06/05/2014  . Osteopenia 05/19/2014  . 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 Hiatal hernia 05/19/2014  . GERD (gastroesophageal reflux disease) 05/19/2014    Past Medical History  Diagnosis Date  . Heart murmur   . Mitral valve prolapse   . Bursitis   . Headache(784.0)     migraines with dizziness  . Osteopenia   . Shingles 03/2010  . Depression   . Elevated cholesterol   . Hiatal hernia     Dr. Collene Mares  . Colitis     microscopic, Dr. Wynetta Emery  . GERD (gastroesophageal reflux disease)   . Ovarian cancer ([place]) 6/15    neg genetic testing    Past Surgical History  Procedure Laterality Date  . Tonsillectomy  1954  . Nasal septum surgery  1979  . Breast  biopsy  09/1999    left breast-benign  . Abdominal hysterectomy      Current Outpatient Prescriptions  Medication Sig Dispense Refill  . Biotin 1000 MCG tablet Take 1,000 mcg by mouth daily.    . calcium citrate-vitamin D (CITRACAL+D) 315-200 MG-UNIT per tablet     . Cholecalciferol (VITAMIN D PO) Take 4,000 Int'l Units by mouth daily.     Marland Kitchen EPINEPHrine (EPIPEN 2-PAK) 0.3 mg/0.3 mL IJ SOAJ injection as needed.    Marland Kitchen esomeprazole (NEXIUM) 40 MG capsule Take 40 mg by mouth daily before breakfast.    . FLUoxetine (PROZAC) 20 MG capsule Take 1 capsule (20 mg total) by mouth 2 (two) times daily. 60 capsule 12  . ibuprofen (ADVIL,MOTRIN) 600 MG tablet Take 600 mg by mouth.    . latanoprost (XALATAN) 0.005 % ophthalmic solution 1 drop at bedtime.    Marland Kitchen levocetirizine (XYZAL) 5 MG tablet Take 5 mg by mouth every evening.  5  . losartan (COZAAR) 25 MG tablet Take 25 mg by mouth daily.    . Magnesium 250 MG TABS Take by mouth daily.    . Multiple Vitamins-Minerals (MULTIVITAMIN PO) Take by mouth.    . NON FORMULARY Allergy injections-one in each are weekly    . Omega-3 Fatty Acids (FISH  OIL PO) Take 4,000 mg by mouth daily.    . Probiotic Product (PROBIOTIC DAILY PO) Take by mouth. Ultra flora B daily    . risedronate (ACTONEL) 150 MG tablet Take 150 mg by mouth every 30 (thirty) days.    . rosuvastatin (CRESTOR) 20 MG tablet Take 20 mg by mouth daily.    . sodium fluoride-calcium carbonate (FLORICAL) 8.3-364 MG CAPS Take 1 capsule by mouth 2 (two) times daily.    . vitamin E (VITAMIN E) 400 UNIT capsule Take 400 Units by mouth daily.     No current facility-administered medications for this visit.     ALLERGIES: Lamisil  Family History  Problem Relation Age of Onset  . Ovarian cancer Mother 71  . Breast cancer Maternal Grandmother     dx. 59 or younger  . Colon cancer Maternal Aunt     dx. 68s  . Diabetes Maternal Grandfather   . Stroke Maternal Grandfather     +EtOH  . Breast cancer  Paternal Aunt     dx. 26 or younger  . Hypertension Father   . Stroke Father   . Other Sister     hx of TAH-BSO  . Colon cancer Maternal Uncle     dx. >50  . Stroke Maternal Uncle   . Depression Paternal Uncle   . Stroke Paternal Grandmother   . Depression Paternal Grandfather   . Breast cancer Maternal Aunt     dx. <50  . Breast cancer Maternal Aunt     dx. >50  . Lung cancer Cousin     dx. <50; smoker  . Stroke Paternal Aunt     Social History   Social History  . Marital Status: Married    Spouse Name: N/A  . Number of Children: N/A  . Years of Education: N/A   Occupational History  . Not on file.   Social History Main Topics  . Smoking status: Former Smoker -- 0.00 packs/day    Types: Cigarettes  . Smokeless tobacco: Never Used     Comment: smoked socially in college--very occ  . Alcohol Use: 1.8 - 2.4 oz/week    3-4 Standard drinks or equivalent per week     Comment: wine  . Drug Use: No  . Sexual Activity:    Partners: Male     Comment: husband vasectomy   Other Topics Concern  . Not on file   Social History Narrative    Review of Systems  Constitutional: Negative.   HENT: Negative.   Eyes: Negative.   Respiratory: Negative.   Cardiovascular: Negative.   Gastrointestinal: Negative.   Genitourinary:       Vaginal itching   Musculoskeletal: Negative.   Skin: Negative.   Neurological: Negative.   Endo/Heme/Allergies: Negative.   Psychiatric/Behavioral: Negative.     PHYSICAL EXAMINATION:    BP 118/82 mmHg  Pulse 84  Resp 14  Wt 128 lb (58.06 kg)  LMP 02/05/2009    General appearance: alert, cooperative and appears stated age  Pelvic: External genitalia:  no lesions, mild to moderate erythema on the lower vulva              Urethra:  normal appearing urethra with no masses, tenderness or lesions              Bartholins and Skenes: normal                 Vagina: normal appearing atrophic vagina with normal color and discharge, no  lesions              Cervix: absent                       Anus:  2 large anal tags, marked erythema, indurated. She has severe erythema and fissures with mild whitening posterior to her anus.   Chaperone was present for exam.  Wet prep: no clue, no trich, + wbc KOH: no yeast PH: 6 +artifact   ASSESSMENT Genital and perianal pruritus for months, vaginal slides with artifact She has a h/o hemorrhoids being treated with proctofoam Skin changes c/w lichen simplex chronicus. Discussed the difficulty of cleaning with the large anal tags/hemorrhoids    PLAN Wet prep probe Treat with steroid ointment Discussed liberal use of vaseline Will refer to Surgery for consultation for consideration of removal of anal tags/hemorrhoids F/U in 2 weeks   An After Visit Summary was printed and given to the patient.  15 minutes face to face time of which over 50% was spent in counseling.

## 2016-05-28 NOTE — Patient Instructions (Signed)

## 2016-05-29 ENCOUNTER — Telehealth: Payer: Self-pay | Admitting: Obstetrics and Gynecology

## 2016-05-29 LAB — WET PREP BY MOLECULAR PROBE
CANDIDA SPECIES: NEGATIVE
Gardnerella vaginalis: NEGATIVE
Trichomonas vaginosis: NEGATIVE

## 2016-05-29 NOTE — Telephone Encounter (Signed)
Return call to patient. She has just returned from Dr. Lorie Apley office. She states that Dr. Collene Mares has diagnosed her with herpes and started her on Valtrex 1 gram po BID for 10 days. Patient upset as she is married and they are only each others partner. Discussed HSV 1 and 2 and testing. Dr. Collene Mares did not perform culture per patient.   Discussed Dr. Ammie Ferrier message at length and importance of use of Valisone sparingly only twice per day. Patient declines atarax at this time, she will try benadryl 25 mg PO at night to see if helps with itching and sleep tonight.  She will try replens after appointemnt.  Offered appointment with Dr. Sabra Heck for tomorrow as patient is concerned about diagnosis differences. She is scheduled for office visit with Dr. Sabra Heck at [phone].  Routing to provider for final review. Patient agreeable to disposition. Will close encounter.

## 2016-05-29 NOTE — Telephone Encounter (Signed)
Return call to patient. She was seen yesterday in office by Dr. Talbert Nan. Has had vaginal itching and hemorrhoids itching since March. Prescribed Valisone by Dr. Talbert Nan yesterday. She used a small amount of Valisone ointment yesterday 4 times, she feels that itching is "terrible" and feels it is worsening after starting ointment. No fevers or vaginal discharge. Affirm negative and patient aware.   Offered office visit with Dr. Sabra Heck today and patient declines, "unless she needs to see me." Advised would obtain advise from Dr. Sabra Heck as Dr. Talbert Nan is not in the office today.  Patient last had colonoscopy with Dr. Collene Mares in 02/2016 and has been using proctosol with some relief, but patient states it is worse. Encouraged patient to call Dr. Collene Mares for office visit for evaluation of hemorrhoids as well if she feels that proctosol is not working as well as it used to.   Advised patient would call back with message from Dr. Sabra Heck. Patient agreeable.

## 2016-05-29 NOTE — Telephone Encounter (Signed)
Affirm testing is negative.  She does not have an "infection".  Ok to try atarax 25mg  nightly.  #20/0RF.  This is an antihistamine and can make her sleepy.  Valisone is only to be used twice daily.  It's an ointment so doesn't wash off easily.  Dr. Talbert Nan thought the skin looked like lichen simplex chronicus due to chronic diarrhea.  If continues, consider a biopsy.  Vaginally, she can use replens vaginal moisturizer as the affirm was negative.  Atrophic changes are the most likely cause.

## 2016-05-29 NOTE — Telephone Encounter (Signed)
Patient says the medication she has for an infection is not working.

## 2016-05-30 ENCOUNTER — Ambulatory Visit (INDEPENDENT_AMBULATORY_CARE_PROVIDER_SITE_OTHER): Payer: Medicare Other | Admitting: Obstetrics & Gynecology

## 2016-05-30 VITALS — BP 122/64 | HR 98 | Resp 18 | Wt 129.6 lb

## 2016-05-30 DIAGNOSIS — L290 Pruritus ani: Secondary | ICD-10-CM

## 2016-05-30 DIAGNOSIS — N766 Ulceration of vulva: Secondary | ICD-10-CM | POA: Diagnosis not present

## 2016-05-30 DIAGNOSIS — K6289 Other specified diseases of anus and rectum: Secondary | ICD-10-CM

## 2016-06-01 ENCOUNTER — Encounter: Payer: Self-pay | Admitting: Obstetrics & Gynecology

## 2016-06-01 NOTE — Progress Notes (Signed)
GYNECOLOGY  VISIT   HPI: 70 y.o. G1P1 Married Caucasian female with increasing issues with rectal irritation.  Saw Dr. Talbert Nan on Wednesday.  Topical steroid and vaseline was recommended.  Pt used the steroid four times in the first day instead of 1-2 due to feeling like it was "wiping off".  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  Pt has been faithful with spouse for many years.  He has been faithful, as well.  This is the second married for both of them.  She has recently started on a systemic oral steroid to help with her colitis.  Pt also on fiber heavy died for her hx of ovarian cancer and it "just tears (her) up.   Causes so much diarrhea for the pt.  Currently on Tamoxifen.  Has really tolerated it fairly well.    Patient's last menstrual period was 02/05/2009.    GYNECOLOGIC HISTORY: Patient's last menstrual period was 02/05/2009.  Patient Active Problem List   Diagnosis Date Noted  . Genetic testing 09/04/2015  . Family history of ovarian cancer 08/14/2015  . Family history of breast cancer in female 08/14/2015  . Family history of colon cancer 08/14/2015  . Dyspareunia 06/03/2015  . Cancer of ovary ([place]) 06/05/2014  . Osteopenia 05/19/2014  . 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 Hiatal hernia 05/19/2014  . GERD (gastroesophageal reflux disease) 05/19/2014    Past Medical History  Diagnosis Date  . Heart murmur   . Mitral valve prolapse   . Bursitis   . Headache(784.0)     migraines with dizziness  . Osteopenia   . Shingles 03/2010  . Depression   . Elevated cholesterol   . Hiatal hernia     Dr. Collene Mares  . Colitis     microscopic, Dr. Wynetta Emery  . GERD (gastroesophageal reflux disease)   . Ovarian cancer ([place]) 6/15    neg genetic testing    Past Surgical History  Procedure Laterality Date  . Tonsillectomy  1954  . Nasal septum surgery  1979  . Breast biopsy  09/1999    left breast-benign  .  Abdominal hysterectomy      MEDS:  Reviewed in EPIC and UTD  ALLERGIES: Lamisil  Family History  Problem Relation Age of Onset  . Ovarian cancer Mother 30  . Breast cancer Maternal Grandmother     dx. 82 or younger  . Colon cancer Maternal Aunt     dx. 77s  . Diabetes Maternal Grandfather   . Stroke Maternal Grandfather     +EtOH  . Breast cancer Paternal Aunt     dx. 65 or younger  . Hypertension Father   . Stroke Father   . Other Sister     hx of TAH-BSO  . Colon cancer Maternal Uncle     dx. >50  . Stroke Maternal Uncle   . Depression Paternal Uncle   . Stroke Paternal Grandmother   . Depression Paternal Grandfather   . Breast cancer Maternal Aunt     dx. <50  . Breast cancer Maternal Aunt     dx. >50  . Lung cancer Cousin     dx. <50; smoker  . Stroke Paternal Aunt     SH:  Married, non smoker  ROS  PHYSICAL EXAMINATION:    BP 122/64 mmHg  Pulse 98  Resp 18  Wt 129 lb 9.6 oz (58.786 kg)  LMP 02/05/2009  Physical Exam  Constitutional: She is oriented to person, place, and time. She appears well-developed and well-nourished.  GI: Soft. Bowel sounds are normal.  Genitourinary: Vagina normal.    There is no rash, tenderness, lesion or injury on the right labia. There is no rash, tenderness, lesion or injury on the left labia.  Lymphadenopathy:       Right: No inguinal adenopathy present.       Left: No inguinal adenopathy present.  Neurological: She is alert and oriented to person, place, and time.  Skin: Skin is warm and dry.  Psychiatric: She has a normal mood and affect.     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  Not convinced about HSV diagnosis.   ~25 minutes spent with patient >50% of time  was in face to face discussion of above.

## 2016-06-02 ENCOUNTER — Telehealth: Payer: Self-pay | Admitting: Obstetrics & Gynecology

## 2016-06-02 LAB — HERPES SIMPLEX VIRUS(HSV) DNA BY PCR
HSV 1 DNA: NOT DETECTED
HSV 2 DNA: NOT DETECTED

## 2016-06-02 NOTE — Telephone Encounter (Signed)
Patient saw Dr Sabra Heck on 05/30/16 for vaginal irritation and had testing done per patient. While she checked out on Friday afternoon, patient stated has an already scheduled follow up with Dr Talbert Nan on 06/12/16. Patient stated she prefers to follow up with Dr Sabra Heck instead due to the testing on 05/30/16 and continuity of care. During checkout on 05/30/16, advised patient that no appointments appeared available and asked if she was ok seeing Dr Talbert Nan. She again stated she preferred Dr Sabra Heck. Patient agreeable to return call from nurse to advise.  No appointments appear available with Dr Sabra Heck.

## 2016-06-02 NOTE — Telephone Encounter (Signed)
Left message to call Kaitlyn at [phone]. 

## 2016-06-03 NOTE — Telephone Encounter (Signed)
Patient is returning a call to [place]. Please call patient's home number [phone].

## 2016-06-03 NOTE — Telephone Encounter (Signed)
Spoke with patient. Follow up appointment rescheduled for patient to see Dr.Miller on 06/20/2016 at 3 pm. She is agreeable to date and time. Asking about her recent results from 05/30/2016. Advised HSV 1 and 2 swabs were both negative. Patient is asking what she needs to do at this time. Reports betamethasone ointment is working well for symptom relief. Advised I will speak with Dr.Miller and return call with further recommendations. She is agreeable.

## 2016-06-04 NOTE — Telephone Encounter (Signed)
Spoke with patient. Advised patient of message as seen below from [place]. She is agreeable and verbalizes understanding. She has a follow up appointment scheduled for 06/20/2016 at 3pm with Dr.Miller.   Routing to provider for final review. Patient agreeable to disposition. Will close encounter.

## 2016-06-04 NOTE — Telephone Encounter (Signed)
She can keep using the steroid and I'd like to recheck in 2-3 weeks.  She will need to eventually stop the steroid but I'd like there to be significant improvement before I taper her off of it.  Thanks for giving the test results.

## 2016-06-12 ENCOUNTER — Ambulatory Visit: Payer: Medicare Other | Admitting: Obstetrics and Gynecology

## 2016-06-20 ENCOUNTER — Encounter: Payer: Self-pay | Admitting: Obstetrics & Gynecology

## 2016-06-20 ENCOUNTER — Ambulatory Visit (INDEPENDENT_AMBULATORY_CARE_PROVIDER_SITE_OTHER): Payer: Medicare Other | Admitting: Obstetrics & Gynecology

## 2016-06-20 VITALS — BP 126/60 | HR 90 | Resp 12 | Ht 63.0 in | Wt 127.4 lb

## 2016-06-20 DIAGNOSIS — L290 Pruritus ani: Secondary | ICD-10-CM

## 2016-06-20 NOTE — Progress Notes (Signed)
GYNECOLOGY  VISIT   HPI: 70 y.o. G1P1 Married Caucasian female for follow up after having significant issues with perianal irritation and swollen hemorrhoids.  Pt initially seen by Dr. Talbert Nan and she was started on Valisone.  Pt was seen the next day by Dr. Collene Mares and HSV was diagnosed by visual appearance.  Pt was devastated with the diagnosis so called the next day and was seen back in our office.  HSV culture (via PCR) was obtaina and this was negative.  Serology was not obtained.  D/W pt use of this test and we decided together not to proceed with this.  She did complete the Valtrex but was changed to clobetasol.  With change in steroid, symptoms significnatly improved.  She continues to have issues with loose stools partly due to the "special diet" she is eating with a post op ovarian cancer study.  She has spoken with one of the nurses involved with the study and she has been able to modify her diet with some success.  Still having more frequent stools than I think she should.  She is seeing Dr. Wendie Agreste next week and will discuss with him.  She will let me know the result of this discussion.  Pt reports rectal irritation is essentially gone.  She is seeing Dr. Marcello Moores for consultation in a couple of weeks as well.     Patient Active Problem List   Diagnosis Date Noted  . Genetic testing 09/04/2015  . Family history of ovarian cancer 08/14/2015  . Family history of breast cancer in female 08/14/2015  . Family history of colon cancer 08/14/2015  . Dyspareunia 06/03/2015  . Cancer of ovary ([place]) 06/05/2014  . Osteopenia 05/19/2014  . 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 Hiatal hernia 05/19/2014  . GERD (gastroesophageal reflux disease) 05/19/2014    Past Medical History  Diagnosis Date  . Heart murmur   . Mitral valve prolapse   . Bursitis   . Headache(784.0)     migraines with dizziness  . Osteopenia   . Shingles 03/2010  . Depression   . Elevated cholesterol   .  Hiatal hernia     Dr. Collene Mares  . Colitis     microscopic, Dr. Wynetta Emery  . GERD (gastroesophageal reflux disease)   . Ovarian cancer ([place]) 6/15    neg genetic testing    Past Surgical History  Procedure Laterality Date  . Tonsillectomy  1954  . Nasal septum surgery  1979  . Breast biopsy  09/1999    left breast-benign  . Abdominal hysterectomy      MEDS:  Reviewed in EPIC and UTD  ALLERGIES: Lamisil  Family History  Problem Relation Age of Onset  . Ovarian cancer Mother 42  . Breast cancer Maternal Grandmother     dx. 9 or younger  . Colon cancer Maternal Aunt     dx. 38s  . Diabetes Maternal Grandfather   . Stroke Maternal Grandfather     +EtOH  . Breast cancer Paternal Aunt     dx. 14 or younger  . Hypertension Father   . Stroke Father   . Other Sister     hx of TAH-BSO  . Colon cancer Maternal Uncle     dx. >50  . Stroke Maternal Uncle   . Depression Paternal Uncle   . Stroke Paternal Grandmother   . Depression Paternal Grandfather   . Breast cancer Maternal Aunt     dx. <50  .  Breast cancer Maternal Aunt     dx. >50  . Lung cancer Cousin     dx. <50; smoker  . Stroke Paternal Aunt     SH:  Married, non-smoker  Review of Systems  Gastrointestinal: Positive for diarrhea.    PHYSICAL EXAMINATION:    BP 126/60 mmHg  Pulse 90  Resp 12  Ht 5\' 3"  (1.6 m)  Wt 127 lb 6.4 oz (57.788 kg)  BMI 22.57 kg/m2  LMP 02/05/2009    Physical Exam  Constitutional: She is oriented to person, place, and time. She appears well-developed and well-nourished.  GI: Soft. Bowel sounds are normal. She exhibits no distension and no mass. There is no tenderness. There is no rebound and no guarding.  Genitourinary: Vagina normal.    There is no rash, tenderness, lesion or injury on the right labia. There is no rash, tenderness, lesion or injury on the left labia.  Musculoskeletal: Normal range of motion.  Lymphadenopathy:       Right: No inguinal adenopathy present.        Left: No inguinal adenopathy present.  Neurological: She is alert and oriented to person, place, and time.  Psychiatric: She has a normal mood and affect.    Chaperone was present for exam.  Assessment: Perianal irritation, likely from increased frequency of loose BMs due to IBS and "special diet" she is eating with post op ovarian cancer study.  Doubt HSVT. Several small white raised plaque appearing lesions perirectal  Plan: Decrease steroid to nightly use.  After six months, pt will decrease to twice weekly.   Follow up six weeks.  If lesions are still present, will biopsy at that time.

## 2016-06-20 NOTE — Patient Instructions (Signed)
Decrease steroid to nightly for six weeks.  Then decrease to twice weekly at night.

## 2016-07-17 ENCOUNTER — Other Ambulatory Visit: Payer: Self-pay | Admitting: Obstetrics & Gynecology

## 2016-07-17 NOTE — Telephone Encounter (Signed)
Medication refill request: Alprazolam Last AEX:  06/01/15 SM Next AEX: 08/29/16 SM Last MMG (if hormonal medication request): 05/16/16 [id number] Refill authorized: 09/01/14 #[id number]. Please advise. Thank you.

## 2016-07-18 NOTE — Telephone Encounter (Signed)
Rx faxed to pharmacy  

## 2016-08-21 ENCOUNTER — Encounter: Payer: Self-pay | Admitting: Obstetrics & Gynecology

## 2016-08-21 ENCOUNTER — Telehealth: Payer: Self-pay | Admitting: Obstetrics & Gynecology

## 2016-08-21 ENCOUNTER — Ambulatory Visit (INDEPENDENT_AMBULATORY_CARE_PROVIDER_SITE_OTHER): Payer: Medicare Other | Admitting: Obstetrics & Gynecology

## 2016-08-21 VITALS — BP 130/80 | HR 94 | Resp 16 | Wt 125.2 lb

## 2016-08-21 DIAGNOSIS — L290 Pruritus ani: Secondary | ICD-10-CM

## 2016-08-21 DIAGNOSIS — F329 Major depressive disorder, single episode, unspecified: Secondary | ICD-10-CM | POA: Diagnosis not present

## 2016-08-21 DIAGNOSIS — F32A Depression, unspecified: Secondary | ICD-10-CM

## 2016-08-21 MED ORDER — BETAMETHASONE VALERATE 0.1 % EX OINT
1.0000 | TOPICAL_OINTMENT | Freq: Two times a day (BID) | CUTANEOUS | 0 refills | Status: DC
Start: 2016-08-21 — End: 2017-02-27

## 2016-08-21 NOTE — Telephone Encounter (Signed)
Spoke with patient. Advised I have spoken with Dr.Miller who recommends that she be seen with Dr.Carey Cottle at [place]. Patient is agreeable. Advised [facility] requires she contact their facility to schedule appointment. She is agreeable and will contact their office at [phone] at this time. She will return call to the office to inform Dr.Miller of her appointment date and time.

## 2016-08-21 NOTE — Telephone Encounter (Signed)
Patient is returning a call to [place]. °

## 2016-08-21 NOTE — Telephone Encounter (Signed)
Spoke with patient. Patient reports she contacted Dr.Mckinney's office as RN was unable to schedule appointment here in office today, RN left voicemail for return call and was advised the patient would need to contact their office to schedule. Patient states she was advised she will have to pay around $250 for her first visit and then $150 for each additional visit with her insurance coverage. Patient is asking for additional recommendations from [place]. Advised I will speak with Dr.Miller and return call with referral recommendations. Patient is agreeable. Patient is anxious to get an appointment and requests a return call today.  Dr.Miller, would [facility] be a good option for this patient? Is there another provider you recommend for this patient?

## 2016-08-21 NOTE — Progress Notes (Signed)
GYNECOLOGY  VISIT   HPI: 70 y.o. G1P1 Married Caucasian female with rectal irritation here for follow up.  She has been using Betamethasone twice weekly for the past couple of weeks.  She's also made several other changes that worked well for her.  Psyllium plus powder was recommended by York Cerise at [facility].  She feels this has really helped.  Pt was on benefiber and that didn't seem to help with the diarrhea.  She has changed her diet some with the study she's in that is all about cruciferous vegetables and increased activity.  She will be in this until sometime in 2018.  She is really motivated to continue in the study and finish.  Overall, she's doing quite well from rectal irritation standpoint.  Feels "normal" now.  Pt also wants to discuss depression symptoms.  Pt is in an ovarian cancer support group with local women.  They've had a death and a second woman who is not doing well.  Pt has long hx of depression.  Has been on Prozac 20mg  that was increased to 40mg  last year.  Has appt with psychiatrist she's seen in the past but it is not until November.  Very anxious about how far away this is.  Would like some other suggestions.  No suicidal or homicidal ideation.  States she has so much to be grateful for but she can't seem to enjoy any of it due to depression.  Tearful today.    Pt has used Wellbutrin in the past with side effects and does not want to start this.  I do not feel comfortable starting another SSRI or SNRI with Prozac.  D/W pt limitations to my comfort area with anti-depressants.  Pt it ok with not having a new prescription today as long as she has a plan.  Has seen therapist, Rodena Goldmann, in the past but feels she needs medication help at this point.    Patient Active Problem List   Diagnosis Date Noted  . Genetic testing 09/04/2015  . Family history of ovarian cancer 08/14/2015  . Family history of breast cancer in female 08/14/2015  . Family history of colon cancer  08/14/2015  . Dyspareunia 06/03/2015  . Cancer of ovary ([place]) 06/05/2014  . Osteopenia 05/19/2014  . 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 Hiatal hernia 05/19/2014  . GERD (gastroesophageal reflux disease) 05/19/2014    Past Medical History:  Diagnosis Date  . Bursitis   . Colitis    microscopic, Dr. Wynetta Emery  . Depression   . Elevated cholesterol   . GERD (gastroesophageal reflux disease)   . Headache(784.0)    migraines with dizziness  . Heart murmur   . Hiatal hernia    Dr. Collene Mares  . Mitral valve prolapse   . Osteopenia   . Ovarian cancer ([place]) 6/15   neg genetic testing  . Shingles 03/2010    Past Surgical History:  Procedure Laterality Date  . ABDOMINAL HYSTERECTOMY    . BREAST BIOPSY  09/1999   left breast-benign  . NASAL SEPTUM SURGERY  1979  . TONSILLECTOMY  1954    MEDS:  Reviewed in EPIC and UTD  ALLERGIES: Lamisil [terbinafine hcl]  Family History  Problem Relation Age of Onset  . Ovarian cancer Mother 80  . Breast cancer Maternal Grandmother     dx. 61 or younger  . Colon cancer Maternal Aunt     dx. 65s  . Diabetes Maternal Grandfather   . Stroke  Maternal Grandfather     +EtOH  . Breast cancer Paternal Aunt     dx. 34 or younger  . Hypertension Father   . Stroke Father   . Other Sister     hx of TAH-BSO  . Colon cancer Maternal Uncle     dx. >50  . Stroke Maternal Uncle   . Depression Paternal Uncle   . Stroke Paternal Grandmother   . Depression Paternal Grandfather   . Breast cancer Maternal Aunt     dx. <50  . Breast cancer Maternal Aunt     dx. >50  . Lung cancer Cousin     dx. <50; smoker  . Stroke Paternal Aunt     SH:  Married, non smoker  Review of Systems  Psychiatric/Behavioral: Positive for depression.  All other systems reviewed and are negative.   PHYSICAL EXAMINATION:    BP 130/80 (BP Location: Left Arm, Patient Position: Sitting, Cuff Size: Normal)   Pulse 94   Resp 16   Wt 125 lb 3.2 oz  (56.8 kg)   LMP 02/05/2009   BMI 22.18 kg/m     General appearance: alert, cooperative and appears stated age  Pelvic: External genitalia:  no lesions              Urethra:  normal appearing urethra with no masses, tenderness or lesions              Bartholins and Skenes: normal                 Anus:  Significantly decreased erythema, small area of hypopigmented tissue without excoriations or ulcerations at 12 and 6 o'clock  Chaperone was present for exam.  Assessment: Worsening depression Rectal irritation, much improved  Plan: Psychiatrist names discussed.  Will have pt call while she is in our office to make sure has appt. Continue Prozac 40mg  daily.  Pt knows to use steroid ointment BID for up to 7 days with flare of rectal irritation.  Knows to call if symptoms continue past that point.   ~30 minutes spent with patient >50% of time was in face to face discussion of above.

## 2016-08-21 NOTE — Telephone Encounter (Signed)
Thanks for the information.  Ok to close encounter. 

## 2016-08-22 NOTE — Telephone Encounter (Signed)
Spoke with patient. Patient states that she contacted [facility] and was advised her OOP cost for her appointments would be the same as they would be at Dr.Mckinney's office. Advised patient I have reviewed this with Dr.Miller yesterday as I was updating her on patient's appointment scheduling and we feel the cost is related to the coverage of psychiatry appointments under her specific insurance and that this will likely be the cost for any facility. Patient is discouraged and unsure if she can wait for her appointment with Dr.Plovski on 10/29/2016. Advised patient I will contact [facility] to see if I can have her appointment with Dr.Plovski moved forward. She is agreeable.  Call to [facility]. Appointment moved to the earliest available time on 09/30/2016 at 1 pm with Dr.Plovski.  Call to patient. Advised of appointment date and time. Patient is agreeable to appointment date and time and is very grateful. Advised patient at any time if she needs anything she may contact our office. If she feels her symptoms of depression are worsening she will need to be seen for immediate evaluation with PCP or contact [facility]. She is agreeable and states "I feel I will be okay now that I have an appointment that is earlier."   Routing to provider for final review. Patient agreeable to disposition. Will close encounter.

## 2016-08-22 NOTE — Telephone Encounter (Signed)
Patient says she is returning a call to Kaitlyn. °

## 2016-08-29 ENCOUNTER — Ambulatory Visit: Payer: Medicare Other | Admitting: Obstetrics & Gynecology

## 2016-09-30 ENCOUNTER — Encounter (HOSPITAL_COMMUNITY): Payer: Self-pay | Admitting: Psychiatry

## 2016-09-30 ENCOUNTER — Ambulatory Visit (INDEPENDENT_AMBULATORY_CARE_PROVIDER_SITE_OTHER): Payer: Medicare Other | Admitting: Psychiatry

## 2016-09-30 VITALS — BP 142/80 | HR 100 | Ht 63.0 in | Wt 124.6 lb

## 2016-09-30 DIAGNOSIS — Z823 Family history of stroke: Secondary | ICD-10-CM | POA: Diagnosis not present

## 2016-09-30 DIAGNOSIS — Z803 Family history of malignant neoplasm of breast: Secondary | ICD-10-CM

## 2016-09-30 DIAGNOSIS — F324 Major depressive disorder, single episode, in partial remission: Secondary | ICD-10-CM

## 2016-09-30 DIAGNOSIS — Z833 Family history of diabetes mellitus: Secondary | ICD-10-CM

## 2016-09-30 DIAGNOSIS — Z800 Family history of malignant neoplasm of digestive organs: Secondary | ICD-10-CM | POA: Diagnosis not present

## 2016-09-30 DIAGNOSIS — Z8249 Family history of ischemic heart disease and other diseases of the circulatory system: Secondary | ICD-10-CM | POA: Diagnosis not present

## 2016-09-30 DIAGNOSIS — Z79899 Other long term (current) drug therapy: Secondary | ICD-10-CM

## 2016-09-30 DIAGNOSIS — Z801 Family history of malignant neoplasm of trachea, bronchus and lung: Secondary | ICD-10-CM

## 2016-09-30 DIAGNOSIS — Z8489 Family history of other specified conditions: Secondary | ICD-10-CM

## 2016-09-30 MED ORDER — FLUOXETINE HCL 40 MG PO CAPS: 40.0000 mg | ORAL_CAPSULE | Freq: Two times a day (BID) | ORAL | 8 refills | Status: DC

## 2016-09-30 MED ORDER — CLONAZEPAM 0.5 MG PO TABS: 0.5000 mg | ORAL_TABLET | Freq: Two times a day (BID) | ORAL | 5 refills | Status: DC

## 2016-09-30 NOTE — Progress Notes (Signed)
Psychiatric Initial Adult Assessment   Patient Identification: Candace Manning MRN:  [id number] Date of Evaluation:  09/30/2016 Referral Source:Dr. Sabra Heck Chief Complaint:   Visit Diagnosis: No diagnosis found.  History of Present Illness: This patient is a 70 year old white female who is married and is well known to me. She was under my care in 2008 was treated that time for major depression with Prozac. The patient did very well but unfortunately 2 years ago was diagnosed with ovarian cancer. She was extensive surgery at this time seems to be cancer free. She also received chemotherapy. It should be noted this patient's mother had died of ovarian cancer when she was 70 years old. The patient is a retired Automotive engineer. The patient says that starting this past July for reasons that she's not clear of overtly she started experiencing significant sadness. He was a mixture of sadness depression and anxiety. The patient says and generally she sleeps fairly well. The patient takes melatonin for sleep. The patient says her mood disturbance is worse in the morning. The patient says in general she is eating well and has good energy. She is no problems thinking or concentrating. She denies worthlessness and has never been suicidal. The patient enjoys reading watching TV and spending time with her children. She goes to church regularly.  The patient denies the use of alcohol or drugs. She denies ever having psychotic symptoms. Patient is never had a manic episode. She's had episodes in the past of major depression and it done well on Prozac. She denies symptoms of generalized anxiety disorder panic disorder or obsessive-compulsive disorder. The patient is never been in a psychiatric hospital before. She did have psychotherapy and was beneficial but she doesn't remember the name of the person. The dynamics of this situation are that the patient is in a support group for people who have ovarian  cancer. Of the 6 people in her group to limit recently died from ovarian cancer. The patient is overwhelmed with survivor guilt. She doesn't know why she had a chance to survive all these other people in her group her younger died. At this time the patient denies any physical complaints at all. She general is healthy. She is active. She certainly is not suicidal.  Associated Signs/Symptoms: Depression Symptoms:  anxiety, (Hypo) Manic Symptoms:   Anxiety Symptoms:   Psychotic Symptoms:   PTSD Symptoms:   Past Psychiatric History: outpatient psychotherapy in treatment with Prozac  Previous Psychotropic Medications: Yes   Substance Abuse History in the last 12 months:  No.  Consequences of Substance Abuse: Negative  Past Medical History:  Past Medical History:  Diagnosis Date  . Bursitis   . Colitis    microscopic, Dr. Wynetta Emery  . Depression   . Elevated cholesterol   . GERD (gastroesophageal reflux disease)   . Headache(784.0)    migraines with dizziness  . Heart murmur   . Hiatal hernia    Dr. Collene Mares  . Mitral valve prolapse   . Osteopenia   . Ovarian cancer ([place]) 6/15   neg genetic testing  . Shingles 03/2010    Past Surgical History:  Procedure Laterality Date  . ABDOMINAL HYSTERECTOMY    . BREAST BIOPSY  09/1999   left breast-benign  . NASAL SEPTUM SURGERY  1979  . TONSILLECTOMY  1954    Family Psychiatric History:   Family History:  Family History  Problem Relation Age of Onset  . Ovarian cancer Mother 47  . Breast cancer Maternal Grandmother  dx. 62 or younger  . Colon cancer Maternal Aunt     dx. 69s  . Diabetes Maternal Grandfather   . Stroke Maternal Grandfather     +EtOH  . Breast cancer Paternal Aunt     dx. 78 or younger  . Hypertension Father   . Stroke Father   . Other Sister     hx of TAH-BSO  . Colon cancer Maternal Uncle     dx. >50  . Stroke Maternal Uncle   . Depression Paternal Uncle   . Stroke Paternal Grandmother   .  Depression Paternal Grandfather   . Breast cancer Maternal Aunt     dx. <50  . Breast cancer Maternal Aunt     dx. >50  . Lung cancer Cousin     dx. <50; smoker  . Stroke Paternal Aunt     Social History:   Social History   Social History  . Marital status: Married    Spouse name: N/A  . Number of children: N/A  . Years of education: N/A   Social History Main Topics  . Smoking status: Never Smoker  . Smokeless tobacco: Never Used     Comment: smoked socially in college--very occ  . Alcohol use 1.8 - 2.4 oz/week    3 - 4 Standard drinks or equivalent per week     Comment: wine  . Drug use: No  . Sexual activity: Yes    Partners: Male     Comment: husband vasectomy   Other Topics Concern  . None   Social History Narrative  . None    Additional Social History:   Allergies:   Allergies  Allergen Reactions  . Lamisil [Terbinafine Hcl] Rash    Metabolic Disorder Labs: No results found for: HGBA1C, MPG No results found for: PROLACTIN No results found for: CHOL, TRIG, HDL, CHOLHDL, VLDL, LDLCALC   Current Medications: Current Outpatient Prescriptions  Medication Sig Dispense Refill  . ALPRAZolam (XANAX) 0.5 MG tablet TAKE 1 TABLET BY MOUTH AT BEDTIME AS NEEDED FOR ANXIETY 30 tablet 1  . betamethasone valerate ointment (VALISONE) 0.1 % Apply 1 application topically 2 (two) times daily. 45 g 0  . Biotin 1000 MCG tablet Take 1,000 mcg by mouth daily.    . calcium-vitamin D 250-100 MG-UNIT tablet Take 1 tablet by mouth 2 (two) times daily.    . Cholecalciferol (VITAMIN D PO) Take 4,000 Int'l Units by mouth daily.     Marland Kitchen EPINEPHrine (EPIPEN 2-PAK) 0.3 mg/0.3 mL IJ SOAJ injection as needed.    Marland Kitchen esomeprazole (NEXIUM) 40 MG capsule Take 40 mg by mouth every other day.     Marland Kitchen FLUoxetine (PROZAC) 40 MG capsule Take 1 capsule (40 mg total) by mouth 2 (two) times daily. 30 capsule 8  . ibuprofen (ADVIL,MOTRIN) 600 MG tablet Take 600 mg by mouth.    . latanoprost (XALATAN)  0.005 % ophthalmic solution 1 drop at bedtime.    Marland Kitchen levocetirizine (XYZAL) 5 MG tablet Take 5 mg by mouth every evening.  5  . losartan (COZAAR) 25 MG tablet Take 25 mg by mouth daily.    . Magnesium 250 MG TABS Take by mouth daily.    . Multiple Vitamins-Minerals (MULTIVITAMIN PO) Take by mouth.    . NON FORMULARY Allergy injections-one in each are weekly    . Omega-3 Fatty Acids (FISH OIL PO) Take 4,000 mg by mouth daily.    . Probiotic Product (PROBIOTIC DAILY PO) Take by mouth. Ultra flora  B daily    . risedronate (ACTONEL) 150 MG tablet Take 150 mg by mouth every 30 (thirty) days.    . rosuvastatin (CRESTOR) 20 MG tablet Take 20 mg by mouth daily.    . sodium fluoride-calcium carbonate (FLORICAL) 8.3-364 MG CAPS Take 1 capsule by mouth 2 (two) times daily.    . tamoxifen (NOLVADEX) 20 MG tablet Take 20 mg by mouth daily.    . vitamin E (VITAMIN E) 400 UNIT capsule Take 400 Units by mouth daily.    . Wheat Dextrin (BENEFIBER PO) Take by mouth.    . clonazePAM (KLONOPIN) 0.5 MG tablet Take 1 tablet (0.5 mg total) by mouth 2 (two) times daily. 60 tablet 5   No current facility-administered medications for this visit.     Neurologic: Headache: No Seizure: No Paresthesias:No  Musculoskeletal: Strength & Muscle Tone: within normal limits Gait & Station: normal Patient leans: N/A  Psychiatric Specialty Exam: ROS  Blood pressure (!) 142/80, pulse 100, height 5\' 3"  (1.6 m), weight 124 lb 9.6 oz (56.5 kg), last menstrual period 02/05/2009.Body mass index is 22.07 kg/m.  General Appearance: Fairly Groomed  Eye Contact:  Good  Speech:  Clear and Coherent  Volume:  Normal  Mood:  Depressed  Affect:  Appropriate  Thought Process:  Goal Directed  Orientation:  Full (Time, Place, and Person)  Thought Content:  NA  Suicidal Thoughts:  No  Homicidal Thoughts:  No  Memory:  NA  Judgement:  Good  Insight:  Good  Psychomotor Activity:  Normal  Concentration:   Recall:  Good  Fund of  Knowledge:Good  Language: Good  Akathisia:  No  Handed:  Right  AIMS (if indicated):    Assets:  Desire for Improvement  ADL's:  Intact  Cognition:   Sleep:      Treatment Plan Summary: At this time this patient's #1 problem is that of major depression but I believe that she's having anxiety for psychological reasons. At this time we'll go ahead and continue her Prozac at 40 mg and will begin her on Klonopin 0.5 mg twice a day. We'll also refer her to the therapist Dr. Doroteo Glassman. The patient is not suicidal. I think psychologically I think the effect of being in a support group for this patient psychologically afflicted her. I think she feels overwhelmed with anxiety. I think anxiety is the fear that she started having recurrence of ovarian cancer. It should be noted that she is very proactive in that her mother had ovarian cancer and she did everything she could to detect its common. Unfortunately despite that she did everything in her power medically she still got ovarian cancer. I think psychologically she think she remains on guard from getting very cancer. She has an ovarian cancer could reappear years later. I think keeps her in a constant state of anxiety. Add to it is support members have died I think it's overwhelming with heart a subconscious level. I think this patient is psychologically sophisticated the benefit by insight oriented therapy and I hope that that she gets when she sees Dr. Doroteo Glassman. She'll return to see me in approximately9 weeks.   Haskel Schroeder, MD 10/24/20172:49 PM

## 2016-10-07 ENCOUNTER — Telehealth (HOSPITAL_COMMUNITY): Payer: Self-pay

## 2016-10-07 NOTE — Telephone Encounter (Signed)
Patient calling because her Oncologist stated that there is a contraindication between Tamoxifen and Prozac and they do not want her on that medication. She would like to know what you suggest. Please review and advise, thank you

## 2016-10-08 MED ORDER — MIRTAZAPINE 30 MG PO TABS: 30.0000 mg | ORAL_TABLET | Freq: Every day | ORAL | 2 refills | Status: DC

## 2016-10-08 NOTE — Telephone Encounter (Signed)
Per Dr. Casimiro Needle, we are changing the Prozac to Remeron, 30 mg 1 po qhs. I sent this to the pharmacy and called the patient to let her know

## 2016-10-14 ENCOUNTER — Telehealth (HOSPITAL_COMMUNITY): Payer: Self-pay

## 2016-10-14 NOTE — Telephone Encounter (Signed)
Patient is calling because ever since she started the Remeron she has been constipated, she read in the side effects that Remeron will cause constipation. Patient said she bought some colace, but that is not helping, she would like to know what you recommend.

## 2016-10-15 NOTE — Telephone Encounter (Signed)
I spoke to Dr. Casimiro Needle and told him that I advised patient to try Miralax and he said to tell her if that is not working to try Mag. Sulfate - I called her and she informed me that the Miralax did in fact work and she would continue to use that. I advised patient to call me if she needs anything else.

## 2016-10-29 ENCOUNTER — Ambulatory Visit (HOSPITAL_COMMUNITY): Payer: Medicare Other | Admitting: Psychiatry

## 2016-12-04 ENCOUNTER — Ambulatory Visit (INDEPENDENT_AMBULATORY_CARE_PROVIDER_SITE_OTHER): Payer: Medicare Other | Admitting: Psychiatry

## 2016-12-04 ENCOUNTER — Encounter (HOSPITAL_COMMUNITY): Payer: Self-pay | Admitting: Psychiatry

## 2016-12-04 VITALS — BP 120/68 | HR 77 | Ht 64.5 in | Wt 132.4 lb

## 2016-12-04 DIAGNOSIS — Z801 Family history of malignant neoplasm of trachea, bronchus and lung: Secondary | ICD-10-CM

## 2016-12-04 DIAGNOSIS — Z8249 Family history of ischemic heart disease and other diseases of the circulatory system: Secondary | ICD-10-CM

## 2016-12-04 DIAGNOSIS — Z833 Family history of diabetes mellitus: Secondary | ICD-10-CM

## 2016-12-04 DIAGNOSIS — Z800 Family history of malignant neoplasm of digestive organs: Secondary | ICD-10-CM

## 2016-12-04 DIAGNOSIS — Z888 Allergy status to other drugs, medicaments and biological substances status: Secondary | ICD-10-CM

## 2016-12-04 DIAGNOSIS — Z803 Family history of malignant neoplasm of breast: Secondary | ICD-10-CM | POA: Diagnosis not present

## 2016-12-04 DIAGNOSIS — F3342 Major depressive disorder, recurrent, in full remission: Secondary | ICD-10-CM

## 2016-12-04 DIAGNOSIS — Z79899 Other long term (current) drug therapy: Secondary | ICD-10-CM

## 2016-12-04 DIAGNOSIS — Z9889 Other specified postprocedural states: Secondary | ICD-10-CM | POA: Diagnosis not present

## 2016-12-04 DIAGNOSIS — Z8041 Family history of malignant neoplasm of ovary: Secondary | ICD-10-CM | POA: Diagnosis not present

## 2016-12-04 DIAGNOSIS — Z823 Family history of stroke: Secondary | ICD-10-CM

## 2016-12-04 MED ORDER — MIRTAZAPINE 30 MG PO TABS: 30.0000 mg | ORAL_TABLET | Freq: Every day | ORAL | 6 refills | Status: DC

## 2016-12-04 MED ORDER — CLONAZEPAM 0.5 MG PO TABS: 0.5000 mg | ORAL_TABLET | Freq: Two times a day (BID) | ORAL | 5 refills | Status: DC

## 2016-12-04 NOTE — Progress Notes (Signed)
Psychiatric Initial Adult Assessment   Patient Identification: Candace Manning MRN:  [id number] Date of Evaluation:  12/04/2016 Referral Source:Dr. Sabra Heck Chief Complaint:   Visit Diagnosis: No diagnosis found.  History of Present Illness: Today the patient is doing well. She says she clearly is better and her last visit. She believes likely that the Klonopin has helped her anxiety a great deal. She no longer awakens in the morning feeling anxious. Generally she is feeling better sense of well-being. She does have however moments were she feels excessively irritable and even angry at times. These are not causing any dysfunction but there are unusual for this individual. The patient has connected with her therapist Dr. Doroteo Glassman and sees her on a relatively regular basis. It is noted the patient's Prozac.discontinued and the patient was changed to Remeron to be consistent with the research study she's in for ovarian cancer. At this time the patient feels physically well. She's not losing weight. She has no physical complaints. She sleeping well and eating well. She's exercising regularly. She is positive and optimistic. Overall she is better and I think the small dose of Klonopin 0.5 mg twice a day is helpful. I think she has a number of dynamics related to her childhood, related to her alcoholism with her parents and with the clinical depression her other experience. Unsure number these issues of her origin are discussed in therapy.  Associated Signs/Symptoms: Depression Symptoms:  anxiety, (Hypo) Manic Symptoms:   Anxiety Symptoms:   Psychotic Symptoms:   PTSD Symptoms:   Past Psychiatric History: outpatient psychotherapy in treatment with Prozac  Previous Psychotropic Medications: Yes   Substance Abuse History in the last 12 months:  No.  Consequences of Substance Abuse: Negative  Past Medical History:  Past Medical History:  Diagnosis Date  . Bursitis   . Colitis    microscopic, Dr. Wynetta Emery  . Depression   . Elevated cholesterol   . GERD (gastroesophageal reflux disease)   . Headache(784.0)    migraines with dizziness  . Heart murmur   . Hiatal hernia    Dr. Collene Mares  . Mitral valve prolapse   . Osteopenia   . Ovarian cancer ([place]) 6/15   neg genetic testing  . Shingles 03/2010    Past Surgical History:  Procedure Laterality Date  . ABDOMINAL HYSTERECTOMY    . BREAST BIOPSY  09/1999   left breast-benign  . NASAL SEPTUM SURGERY  1979  . TONSILLECTOMY  1954    Family Psychiatric History:   Family History:  Family History  Problem Relation Age of Onset  . Ovarian cancer Mother 63  . Breast cancer Maternal Grandmother     dx. 67 or younger  . Colon cancer Maternal Aunt     dx. 84s  . Diabetes Maternal Grandfather   . Stroke Maternal Grandfather     +EtOH  . Breast cancer Paternal Aunt     dx. 69 or younger  . Hypertension Father   . Stroke Father   . Other Sister     hx of TAH-BSO  . Colon cancer Maternal Uncle     dx. >50  . Stroke Maternal Uncle   . Depression Paternal Uncle   . Stroke Paternal Grandmother   . Depression Paternal Grandfather   . Breast cancer Maternal Aunt     dx. <50  . Breast cancer Maternal Aunt     dx. >50  . Lung cancer Cousin     dx. <50; smoker  . Stroke  Paternal Aunt     Social History:   Social History   Social History  . Marital status: Married    Spouse name: N/A  . Number of children: N/A  . Years of education: N/A   Social History Main Topics  . Smoking status: Never Smoker  . Smokeless tobacco: Never Used     Comment: smoked socially in college--very occ  . Alcohol use 1.8 - 2.4 oz/week    3 - 4 Standard drinks or equivalent per week     Comment: wine  . Drug use: No  . Sexual activity: Yes    Partners: Male     Comment: husband vasectomy   Other Topics Concern  . None   Social History Narrative  . None    Additional Social History:   Allergies:   Allergies  Allergen  Reactions  . Lamisil [Terbinafine Hcl] Rash    Metabolic Disorder Labs: No results found for: HGBA1C, MPG No results found for: PROLACTIN No results found for: CHOL, TRIG, HDL, CHOLHDL, VLDL, LDLCALC   Current Medications: Current Outpatient Prescriptions  Medication Sig Dispense Refill  . ALPRAZolam (XANAX) 0.5 MG tablet TAKE 1 TABLET BY MOUTH AT BEDTIME AS NEEDED FOR ANXIETY 30 tablet 1  . betamethasone valerate ointment (VALISONE) 0.1 % Apply 1 application topically 2 (two) times daily. 45 g 0  . Biotin 1000 MCG tablet Take 1,000 mcg by mouth daily.    . calcium-vitamin D 250-100 MG-UNIT tablet Take 1 tablet by mouth 2 (two) times daily.    . Cholecalciferol (VITAMIN D PO) Take 4,000 Int'l Units by mouth daily.     . clonazePAM (KLONOPIN) 0.5 MG tablet Take 1 tablet (0.5 mg total) by mouth 2 (two) times daily. 60 tablet 5  . EPINEPHrine (EPIPEN 2-PAK) 0.3 mg/0.3 mL IJ SOAJ injection as needed.    Marland Kitchen esomeprazole (NEXIUM) 40 MG capsule Take 40 mg by mouth every other day.     . ibuprofen (ADVIL,MOTRIN) 600 MG tablet Take 600 mg by mouth.    . latanoprost (XALATAN) 0.005 % ophthalmic solution 1 drop at bedtime.    Marland Kitchen levocetirizine (XYZAL) 5 MG tablet Take 5 mg by mouth every evening.  5  . losartan (COZAAR) 25 MG tablet Take 25 mg by mouth daily.    . Magnesium 250 MG TABS Take by mouth daily.    . mirtazapine (REMERON) 30 MG tablet Take 1 tablet (30 mg total) by mouth at bedtime. 30 tablet 6  . Multiple Vitamins-Minerals (MULTIVITAMIN PO) Take by mouth.    . NON FORMULARY Allergy injections-one in each are weekly    . Omega-3 Fatty Acids (FISH OIL PO) Take 4,000 mg by mouth daily.    . Probiotic Product (PROBIOTIC DAILY PO) Take by mouth. Ultra flora B daily    . risedronate (ACTONEL) 150 MG tablet Take 150 mg by mouth every 30 (thirty) days.    . rosuvastatin (CRESTOR) 20 MG tablet Take 20 mg by mouth daily.    . sodium fluoride-calcium carbonate (FLORICAL) 8.3-364 MG CAPS Take 1  capsule by mouth 2 (two) times daily.    . tamoxifen (NOLVADEX) 20 MG tablet Take 20 mg by mouth daily.    . vitamin E (VITAMIN E) 400 UNIT capsule Take 400 Units by mouth daily.    . Wheat Dextrin (BENEFIBER PO) Take by mouth.     No current facility-administered medications for this visit.     Neurologic: Headache: No Seizure: No Paresthesias:No  Musculoskeletal: Strength &  Muscle Tone: within normal limits Gait & Station: normal Patient leans: N/A  Psychiatric Specialty Exam: ROS  Blood pressure 120/68, pulse 77, height 5' 4.5" (1.638 m), weight 132 lb 6.4 oz (60.1 kg), last menstrual period 02/05/2009.Body mass index is 22.38 kg/m.  General Appearance: Fairly Groomed  Eye Contact:  Good  Speech:  Clear and Coherent  Volume:  Normal  Mood:  Depressed  Affect:  Appropriate  Thought Process:  Goal Directed  Orientation:  Full (Time, Place, and Person)  Thought Content:  NA  Suicidal Thoughts:  No  Homicidal Thoughts:  No  Memory:  NA  Judgement:  Good  Insight:  Good  Psychomotor Activity:  Normal  Concentration:   Recall:  Good  Fund of Knowledge:Good  Language: Good  Akathisia:  No  Handed:  Right  AIMS (if indicated):    Assets:  Desire for Improvement  ADL's:  Intact  Cognition:   Sleep:      Treatment Plan Summary: At this time the patient will continue seeing Dr. Doroteo Glassman. She'll continue taking Klonopin 0.5 mg twice a day. Been very helpful for her. Noted is now she takes Remeron 30 mg which is consistent with her study that she's in for ovarian cancer. The patient denies any physical complaints. She'll return to see me in 5 months for a 30 minute visit.  Haskel Schroeder, MD 12/28/20173:53 PM

## 2017-02-03 ENCOUNTER — Telehealth (HOSPITAL_COMMUNITY): Payer: Self-pay

## 2017-02-03 NOTE — Telephone Encounter (Signed)
Patient is calling, she states she is having memory loos and is not sure if it is medications or "chemo brain" but she would like to know if you would recommend Cerefolin - this is a medication supplement that is supposed to help with memory loss.

## 2017-02-04 NOTE — Telephone Encounter (Signed)
I spoke to Dr. Casimiro Needle and he said he would be fine with the patient taking this. I called patient and left a voicemail letting her know

## 2017-02-10 ENCOUNTER — Telehealth: Payer: Self-pay | Admitting: Obstetrics & Gynecology

## 2017-02-10 NOTE — Telephone Encounter (Signed)
This is fine.  Thanks so much.

## 2017-02-10 NOTE — Telephone Encounter (Signed)
Patient having some rectal itching and would like to see Dr. Sabra Heck.

## 2017-02-10 NOTE — Telephone Encounter (Signed)
Spoke with patient after review with nursing supervisor Lamont Snowball, RN. Advised patient an emergency case has been added onto the surgery schedule for today and we are unsure of Dr.Miller's schedule for the afternoon. Patient prefers to see Dr.Miller. Appointment scheduled for 02/12/2017 at 10:30 am with Dr.Miller. Patient states she has been using Proctosol for rectal itching with some relief. Is using Betamethasone between rectum and vagina for itching in that area. Applying very light amounts. Keeping the area clean and dry. Aware she may continue using both medications until she is seen on 3/8 with Dr.Miller. Aware if symptoms worsen will need to contact the office to move appointment up. Advised RN will review with Dr.Miller with return to the office this afternoon and return call with any additional recommendations. Patient is agreeable.

## 2017-02-10 NOTE — Telephone Encounter (Signed)
Spoke with patient. Patient states that she is having rectal itching and requests to see Dr.Miller for evaluation. Reports this is an ongoing problem that she has been seen for before. 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 Has steroid ointment she uses for rectal itching as needed. States she does not feel she is able to be seen on Thursday and needs an earlier appointment. Offered an appointment this morning with another provider. Patient wants to see MD only. All MD's are in surgery this morning. Will review with nursing supervisor and return call regarding appointment scheduling. Patient is agreeable.

## 2017-02-12 ENCOUNTER — Ambulatory Visit (INDEPENDENT_AMBULATORY_CARE_PROVIDER_SITE_OTHER): Payer: Medicare Other | Admitting: Obstetrics & Gynecology

## 2017-02-12 ENCOUNTER — Encounter: Payer: Self-pay | Admitting: Obstetrics & Gynecology

## 2017-02-12 VITALS — BP 128/70 | HR 80 | Resp 16 | Wt 131.0 lb

## 2017-02-12 DIAGNOSIS — L290 Pruritus ani: Secondary | ICD-10-CM

## 2017-02-12 MED ORDER — HYDROXYZINE HCL 25 MG PO TABS: 25.0000 mg | ORAL_TABLET | Freq: Every evening | ORAL | 0 refills | Status: DC | PRN

## 2017-02-12 MED ORDER — CLOBETASOL PROPIONATE 0.05 % EX OINT: 1.0000 "application " | TOPICAL_OINTMENT | Freq: Two times a day (BID) | CUTANEOUS | 0 refills | Status: DC

## 2017-02-12 NOTE — Progress Notes (Signed)
GYNECOLOGY  VISIT   HPI: 71 y.o. G1P1 Married Caucasian female with history of ovarian cancer diagnosed 2015 and followed at [facility] without any current evidence of recurrence.  Pt has hx of IBS and intermittent issues with rectal irritation.  Did see Dr. Marcello Moores at [facility] due to hemorrhoids.  She has a sheet of paper of pericare she is doing--wanted to make sure I knew everything she was doing.  Using Proctosol cream after every bowel movement.  Trying not to use soap but doesn't feel clean this way.  Using Cotonelle towelettes to wipe and not toilet paper--was advised not to use any toilet paper.  Feels like she it itching all the time and wakes up at night itching.  Feels like this is driving her crazy.  Using topical betamethsone, really small amount topically 1-2 times daily.  I have not done a skin biopsy.  Had similar issues in 6/17 but was having much more issues with diarrhea at that time.  Now she is using psyllium with good success.  Typically has 2 bowel movements daily, not loose but soft.   Negative colonoscopy 02/18/16 with Dr. Collene Mares.  GYNECOLOGIC HISTORY: Patient's last menstrual period was 02/05/2009. Contraception: hysterectomy Menopausal hormone therapy: none  Patient Active Problem List   Diagnosis Date Noted  . Genetic testing 09/04/2015  . Family history of ovarian cancer 08/14/2015  . Family history of breast cancer in female 08/14/2015  . Family history of colon cancer 08/14/2015  . Dyspareunia 06/03/2015  . Cancer of ovary ([place]) 06/05/2014  . Osteopenia 05/19/2014  . 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 Hiatal hernia 05/19/2014  . GERD (gastroesophageal reflux disease) 05/19/2014    Past Medical History:  Diagnosis Date  . Bursitis   . Colitis    microscopic, Dr. Wynetta Emery  . Depression   . Elevated cholesterol   . GERD (gastroesophageal reflux disease)   . Headache(784.0)    migraines with dizziness  . Heart murmur   . Hiatal  hernia    Dr. Collene Mares  . Mitral valve prolapse   . Osteopenia   . Ovarian cancer ([place]) 6/15   neg genetic testing  . Shingles 03/2010    Past Surgical History:  Procedure Laterality Date  . ABDOMINAL HYSTERECTOMY    . BREAST BIOPSY  09/1999   left breast-benign  . NASAL SEPTUM SURGERY  1979  . TONSILLECTOMY  1954    MEDS:  Reviewed in EPIC and UTD  ALLERGIES: Lamisil [terbinafine hcl]  Family History  Problem Relation Age of Onset  . Ovarian cancer Mother 49  . Breast cancer Maternal Grandmother     dx. 21 or younger  . Colon cancer Maternal Aunt     dx. 8s  . Diabetes Maternal Grandfather   . Stroke Maternal Grandfather     +EtOH  . Breast cancer Paternal Aunt     dx. 31 or younger  . Hypertension Father   . Stroke Father   . Other Sister     hx of TAH-BSO  . Colon cancer Maternal Uncle     dx. >50  . Stroke Maternal Uncle   . Depression Paternal Uncle   . Stroke Paternal Grandmother   . Depression Paternal Grandfather   . Breast cancer Maternal Aunt     dx. <50  . Breast cancer Maternal Aunt     dx. >50  . Lung cancer Cousin     dx. <50; smoker  . Stroke Paternal  Aunt     SH:  Married, non smoker  Review of Systems  Constitutional: Negative.   Gastrointestinal: Negative for abdominal pain, blood in stool, constipation, diarrhea and melena.       Rectal itching.  No bleeding.  Genitourinary: Negative.   Endo/Heme/Allergies: Negative.   Psychiatric/Behavioral: Negative.     PHYSICAL EXAMINATION:    BP 128/70 (BP Location: Right Arm, Patient Position: Sitting, Cuff Size: Normal)   Pulse 80   Resp 16   Wt 131 lb (59.4 kg)   LMP 02/05/2009   BMI 22.14 kg/m     General appearance: alert, cooperative and appears stated age  Pelvic: External genitalia:  no lesions              Urethra:  normal appearing urethra with no masses, tenderness or lesions              Bartholins and Skenes: normal                 Vagina: normal appearing vagina with  normal color and discharge, no lesions              Rectum:  Thickened whitish tissue surrounding the rectum with small fissures in multiple places.  Two small, non tender and non thrombosed hemorrhoids  Chaperone was present for exam.  Assessment: Rectal irritation H/O microscopic colitis, negative colonoscopy 3/17 Small hemorrhoids  Plan: Stop using the Cotonelle wipes and use the regular/plain Scott toilet paper only.  Change steroid ointment to Clobetasol 0.05% morning and evening.  Apply in the morning after morning bowel movement.    Cetaphil soap ok.  Scott toilet paper and no fabric softener with underwear.  Stop the proctosol for now.  Hydroxyzine 25mg  nightly for helping to control the itching.  #30/0RF.  Recheck 2 weeks.   ~30 minutes spent with patient >50% of time was in face to face discussion of above.

## 2017-02-12 NOTE — Patient Instructions (Addendum)
Stop using the Cotonelle wipes and use the regular/plain Scott toilet paper only.  I'm going to change your steroid ointment to Clobetasol 0.05% morning and evening.  Apply in the morning after you have a bowel movement.    Cetaphil soap is fine.  Don't buy new underwear.    Don't use a fabric softener with your underwear.    Stop the proctosol for now.  Use the oral Hydroxyzine 25mg  nightly for helping to control the itching.

## 2017-02-27 ENCOUNTER — Encounter: Payer: Self-pay | Admitting: Obstetrics & Gynecology

## 2017-02-27 ENCOUNTER — Ambulatory Visit (INDEPENDENT_AMBULATORY_CARE_PROVIDER_SITE_OTHER): Payer: Medicare Other | Admitting: Obstetrics & Gynecology

## 2017-02-27 VITALS — BP 124/78 | HR 80 | Resp 12 | Ht 63.0 in | Wt 134.0 lb

## 2017-02-27 DIAGNOSIS — L290 Pruritus ani: Secondary | ICD-10-CM

## 2017-02-27 MED ORDER — HYDROXYZINE HCL 25 MG PO TABS: 25.0000 mg | ORAL_TABLET | Freq: Every evening | ORAL | 1 refills | Status: DC | PRN

## 2017-02-27 NOTE — Patient Instructions (Addendum)
Apply ointment nightly for the next month.  Continue the nightly 25mg  atarax/hydroxyzine.  Come back in a month.  When you see the neurologist.  You might want to ask about neuropsychological testing.

## 2017-02-27 NOTE — Progress Notes (Signed)
GYNECOLOGY  VISIT   HPI: 71 y.o. G1P1 Married Caucasian female here for follow up of perirectal itching and irritation.  Pt states "you are a saint" and that she cannot believe how much better the itching is in just a short time.  The clobetasol helped almost immediately.  It's helped so much so is only using it once daily at this point.  She is also taking the atarax and this seems to have significantly decreased the itching that was occurring at night while asleep.  Reports her husband has actually come back to their bed (as he was sleeping in another room due to her itching and movement at night).  No rectal bleeding.  GYNECOLOGIC HISTORY: Patient's last menstrual period was 02/05/2009. Contraception: PMP Menopausal hormone therapy: none  Patient Active Problem List   Diagnosis Date Noted  . Genetic testing 09/04/2015  . Family history of ovarian cancer 08/14/2015  . Family history of breast cancer in female 08/14/2015  . Family history of colon cancer 08/14/2015  . Dyspareunia 06/03/2015  . Cancer of ovary ([place]) 06/05/2014  . Osteopenia 05/19/2014  . 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 Hiatal hernia 05/19/2014  . GERD (gastroesophageal reflux disease) 05/19/2014    Past Medical History:  Diagnosis Date  . Bursitis   . Colitis    microscopic, Dr. Wynetta Emery  . Depression   . Elevated cholesterol   . GERD (gastroesophageal reflux disease)   . Headache(784.0)    migraines with dizziness  . Heart murmur   . Hiatal hernia    Dr. Collene Mares  . Mitral valve prolapse   . Osteopenia   . Ovarian cancer ([place]) 6/15   neg genetic testing  . Shingles 03/2010    Past Surgical History:  Procedure Laterality Date  . ABDOMINAL HYSTERECTOMY    . BREAST BIOPSY  09/1999   left breast-benign  . NASAL SEPTUM SURGERY  1979  . TONSILLECTOMY  1954    MEDS:  Reviewed in EPIC and UTD  ALLERGIES: Lamisil [terbinafine hcl]  Family History  Problem Relation Age of Onset  .  Ovarian cancer Mother 1  . Breast cancer Maternal Grandmother     dx. 38 or younger  . Colon cancer Maternal Aunt     dx. 44s  . Diabetes Maternal Grandfather   . Stroke Maternal Grandfather     +EtOH  . Breast cancer Paternal Aunt     dx. 26 or younger  . Hypertension Father   . Stroke Father   . Other Sister     hx of TAH-BSO  . Colon cancer Maternal Uncle     dx. >50  . Stroke Maternal Uncle   . Depression Paternal Uncle   . Stroke Paternal Grandmother   . Depression Paternal Grandfather   . Breast cancer Maternal Aunt     dx. <50  . Breast cancer Maternal Aunt     dx. >50  . Lung cancer Cousin     dx. <50; smoker  . Stroke Paternal Aunt     SH:  Married, non smoker  Review of Systems  All other systems reviewed and are negative.   PHYSICAL EXAMINATION:    BP 124/78 (BP Location: Left Arm, Patient Position: Sitting, Cuff Size: Normal)   Pulse 80   Resp 12   Ht 5\' 3"  (1.6 m)   Wt 134 lb (60.8 kg)   LMP 02/05/2009   BMI 23.74 kg/m     Physical Exam  Constitutional:  She is oriented to person, place, and time. She appears well-developed.  Genitourinary: Vagina normal.    There is no rash, tenderness or lesion on the right labia. There is no rash, tenderness, lesion or injury on the left labia.  Genitourinary Comments: No vaginal lesions.  Absent cervix, uterus, and ovaries.  Lymphadenopathy:       Right: No inguinal adenopathy present.       Left: No inguinal adenopathy present.  Neurological: She is alert and oriented to person, place, and time.  Skin: Skin is warm and dry.  Psychiatric: She has a normal mood and affect.   Assessment: Perirectal irritation and itching  Plan: Continue atarax 25mg  nightly and as well as topical clobetasol.  Pt will use only at night.  Advised to continue for one month unless itching completely stops.  If this is the case, then decreased to twice weekly for two to three weeks and then stop. Follow up 1 month.

## 2017-03-19 ENCOUNTER — Other Ambulatory Visit (HOSPITAL_COMMUNITY): Payer: Self-pay | Admitting: Psychiatry

## 2017-03-26 ENCOUNTER — Telehealth (HOSPITAL_COMMUNITY): Payer: Self-pay

## 2017-03-26 NOTE — Telephone Encounter (Signed)
Patient is calling to let you know she saw a neurologist about her memory loss and he recommended that she stop the Klonopin. Patient did not want to stop cold [place], so she is reducing the amount she takes and is taking a 1/2 tab bid. I told patient that I would let you know and she could discuss with you at her visit next week.

## 2017-03-27 ENCOUNTER — Ambulatory Visit (INDEPENDENT_AMBULATORY_CARE_PROVIDER_SITE_OTHER): Payer: Medicare Other | Admitting: Obstetrics & Gynecology

## 2017-03-27 ENCOUNTER — Other Ambulatory Visit (HOSPITAL_BASED_OUTPATIENT_CLINIC_OR_DEPARTMENT_OTHER): Payer: Self-pay | Admitting: Neurology

## 2017-03-27 ENCOUNTER — Encounter: Payer: Self-pay | Admitting: Obstetrics & Gynecology

## 2017-03-27 VITALS — BP 130/76 | HR 60 | Resp 16 | Ht 63.0 in | Wt 130.0 lb

## 2017-03-27 DIAGNOSIS — R413 Other amnesia: Secondary | ICD-10-CM | POA: Diagnosis not present

## 2017-03-27 DIAGNOSIS — L290 Pruritus ani: Secondary | ICD-10-CM

## 2017-03-27 DIAGNOSIS — C569 Malignant neoplasm of unspecified ovary: Secondary | ICD-10-CM | POA: Diagnosis not present

## 2017-03-27 MED ORDER — CLOBETASOL PROPIONATE 0.05 % EX OINT: 1.0000 "application " | TOPICAL_OINTMENT | Freq: Two times a day (BID) | CUTANEOUS | 0 refills | Status: DC

## 2017-03-27 NOTE — Patient Instructions (Addendum)
Use the steroid ointment at night two to three times weekly for the next month.  Then stop completely--at the end of May.  If you start the have issues immediately, please call.  If it is several weeks or a couple (or more) months from now, you can just use the ointment twice daily for 5-7 days and stop when itching goes away.    If you have to use this more than two times in a month or if the itching persists after using the ointment for one week, PLEASE CALL.  When you see Dr. Marlaine Hind and if he feels comfortable seeing you less frequently, I'm happy to see you in between his visits.  You can talk about this with him if you want.  I'm happy to help if I can.

## 2017-03-27 NOTE — Progress Notes (Signed)
GYNECOLOGY  VISIT   HPI: 71 y.o. G1P1 Married Caucasian female here for follow up of rectal itching.  Reports she feels so much better.   Has been using the steroid ointment only at night for the last month.  Denies any itching.  Is teary eyed today about her memory.  Having significant short term memory loss that is causing a lot of anxiety.  Saw neurologist in [place].  Her daughter helped set this up for her.  Is going to have an MRI scheduled and neuropsychological evaluation.  She is tapering down off the Klonopin as the neurologist felt it was related to this.  Support given.  Doesn't really need any recommendations just wanted to share what's going on and her concerns.    Has follow up with Dr. Marlaine Hind next week.  This is near the 3 year mark since her diagnosis which was 6/15.  Wondering if he is going to see her less frequently.  Still has her port.  Feels it is her lucky charm.  GYNECOLOGIC HISTORY: Patient's last menstrual period was 02/05/2009. Contraception: Post menopausal  Menopausal hormone therapy: None  Patient Active Problem List   Diagnosis Date Noted  . Genetic testing 09/04/2015  . Family history of ovarian cancer 08/14/2015  . Family history of breast cancer in female 08/14/2015  . Family history of colon cancer 08/14/2015  . Dyspareunia 06/03/2015  . Cancer of ovary ([place]) 06/05/2014  . Osteopenia 05/19/2014  . 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 Hiatal hernia 05/19/2014  . GERD (gastroesophageal reflux disease) 05/19/2014    Past Medical History:  Diagnosis Date  . Bursitis   . Colitis    microscopic, Dr. Wynetta Emery  . Depression   . Elevated cholesterol   . GERD (gastroesophageal reflux disease)   . Headache(784.0)    migraines with dizziness  . Heart murmur   . Hiatal hernia    Dr. Collene Mares  . Mitral valve prolapse   . Osteopenia   . Ovarian cancer ([place]) 6/15   neg genetic testing  . Shingles 03/2010    Past Surgical History:   Procedure Laterality Date  . ABDOMINAL HYSTERECTOMY    . BREAST BIOPSY  09/1999   left breast-benign  . NASAL SEPTUM SURGERY  1979  . TONSILLECTOMY  1954    MEDS:  Reviewed in EPIC and UTD  ALLERGIES: Lamisil [terbinafine hcl]  Family History  Problem Relation Age of Onset  . Ovarian cancer Mother 43  . Breast cancer Maternal Grandmother     dx. 28 or younger  . Colon cancer Maternal Aunt     dx. 8s  . Diabetes Maternal Grandfather   . Stroke Maternal Grandfather     +EtOH  . Breast cancer Paternal Aunt     dx. 69 or younger  . Hypertension Father   . Stroke Father   . Other Sister     hx of TAH-BSO  . Colon cancer Maternal Uncle     dx. >50  . Stroke Maternal Uncle   . Depression Paternal Uncle   . Stroke Paternal Grandmother   . Depression Paternal Grandfather   . Breast cancer Maternal Aunt     dx. <50  . Breast cancer Maternal Aunt     dx. >50  . Lung cancer Cousin     dx. <50; smoker  . Stroke Paternal Aunt     SH:  Married, non smoker  Review of Systems  All other systems reviewed and  are negative.   PHYSICAL EXAMINATION:    BP 130/76 (BP Location: Left Arm, Patient Position: Sitting, Cuff Size: Normal)   Pulse 60   Resp 16   Ht 5\' 3"  (1.6 m)   Wt 130 lb (59 kg)   LMP 02/05/2009   BMI 23.03 kg/m     Physical Exam  Constitutional: She is oriented to person, place, and time. She appears well-developed and well-nourished.  Genitourinary: Vagina normal.    There is no rash, tenderness, lesion or injury on the right labia. There is no rash, tenderness, lesion or injury on the left labia.  Lymphadenopathy:       Right: No inguinal adenopathy present.       Left: No inguinal adenopathy present.  Neurological: She is alert and oriented to person, place, and time.  Skin: Skin is warm and dry.  Psychiatric: She has a normal mood and affect.    Chaperone was present for exam.  Assessment: Perianal itching that has completely resolved H/O  ovarian cancer [facility] diagnosed 6/14, has follow up at [facility] next week Short term memory loss  Plan: Instructions given for tapering off steroid ointment and when to use if having a flare.  All of this was written down for pt.  RF for Clobetasol 0.05 % ointment sent to pharmacy on file.   ~20 minutes spent with patient >50% of time was in face to face discussion of above concerns.

## 2017-04-02 ENCOUNTER — Encounter (HOSPITAL_COMMUNITY): Payer: Self-pay | Admitting: Psychiatry

## 2017-04-02 ENCOUNTER — Ambulatory Visit (INDEPENDENT_AMBULATORY_CARE_PROVIDER_SITE_OTHER): Payer: Medicare Other | Admitting: Psychiatry

## 2017-04-02 ENCOUNTER — Other Ambulatory Visit (HOSPITAL_COMMUNITY): Payer: Self-pay | Admitting: Psychiatry

## 2017-04-02 VITALS — BP 148/70 | HR 109 | Ht 63.5 in | Wt 126.0 lb

## 2017-04-02 DIAGNOSIS — Z818 Family history of other mental and behavioral disorders: Secondary | ICD-10-CM

## 2017-04-02 DIAGNOSIS — F320 Major depressive disorder, single episode, mild: Secondary | ICD-10-CM | POA: Diagnosis not present

## 2017-04-02 DIAGNOSIS — Z79899 Other long term (current) drug therapy: Secondary | ICD-10-CM

## 2017-04-02 NOTE — Progress Notes (Signed)
Psychiatric Initial Adult Assessment   Patient Identification: Candace Manning MRN:  [id number] Date of Evaluation:  04/02/2017 Referral Source:Dr. Sabra Heck Chief Complaint:   Chief Complaint    Follow-up     Visit Diagnosis: No diagnosis found.  History of Present Illness:Today the patient is seen with her friend Candace Manning. Unfortunately for the last 3 months she's been experiencing change in her memory. She says between Thanksgiving and Christmas she notes that she can't remember things short-term eyes. She's asking the same questions over and over again. Her husband says her memory is changed. Looking closely things that could've done this. Medically she is been well. She's recently saw a neurologist considers the possibility could be the Klonopin at low-dose of started previous to this change. When she was seen after being on the Klonopin she denied any problems memory but felt actually much better. The Klonopin clearly helps her anxiety. She didn't complain of any memory loss. Patient has no physical complaints. She has no abdominal pain. She has no fevers or chills. The neurologist  is doing a thorough workup including an MRI this coming up in the next week or 2 and neuropsychological testing I also be done the next week. The patient is obviously very anxious. She feels very distressed. She feels very strange that she cannot remember things short-term wise. She is no family history of dementia. It is reasonable the outside chance the Klonopin could be contributing to this phenomenon. This is something I have not seen. We all hope it is related to the Klonopin because she's going to discontinue it over the next 2 days. I shared with her that'll take at least a week after that affords out of her system. It is a contributor to her memory loss it'll take another few weeks to a month before its effects hopefully will be gone. Will go ahead and also reduce her Remeron from 30 mg down to 15 mg. The patient is  not eating. The patient is not suicidal. Candace Manning and her working hard together to try to find the reason why she's had memory loss.    Associated Signs/Symn Symptoms:  anxiety, (Hypo) Manic Symptoms:   Anxiety Symptoms:   Psychotic Symptoms:   PTSD Symptoms:   Past Psychiatric History: outpatient psychotherapy in treatment with Prozac  Previous Psychotropic Medications: Yes   Substance Abuse History in the last 12 months:  No.  Consequences of Substance Abuse: Negative  Past Medical History:  Past Medical History:  Diagnosis Date  . Bursitis   . Colitis    microscopic, Dr. Wynetta Emery  . Depression   . Elevated cholesterol   . GERD (gastroesophageal reflux disease)   . Headache(784.0)    migraines with dizziness  . Heart murmur   . Hiatal hernia    Dr. Collene Mares  . Mitral valve prolapse   . Osteopenia   . Ovarian cancer ([place]) 6/15   neg genetic testing  . Shingles 03/2010    Past Surgical History:  Procedure Laterality Date  . ABDOMINAL HYSTERECTOMY    . BREAST BIOPSY  09/1999   left breast-benign  . NASAL SEPTUM SURGERY  1979  . TONSILLECTOMY  1954    Family Psychiatric History:   Family History:  Family History  Problem Relation Age of Onset  . Ovarian cancer Mother 72  . Breast cancer Maternal Grandmother     dx. 23 or younger  . Colon cancer Maternal Aunt     dx. 52s  . Diabetes Maternal Grandfather   .  Stroke Maternal Grandfather     +EtOH  . Breast cancer Paternal Aunt     dx. 14 or younger  . Hypertension Father   . Stroke Father   . Other Sister     hx of TAH-BSO  . Colon cancer Maternal Uncle     dx. >50  . Stroke Maternal Uncle   . Depression Paternal Uncle   . Stroke Paternal Grandmother   . Depression Paternal Grandfather   . Breast cancer Maternal Aunt     dx. <50  . Breast cancer Maternal Aunt     dx. >50  . Lung cancer Cousin     dx. <50; smoker  . Stroke Paternal Aunt     Social History:   Social History   Social History  .  Marital status: Married    Spouse name: N/A  . Number of children: N/A  . Years of education: N/A   Social History Main Topics  . Smoking status: Never Smoker  . Smokeless tobacco: Never Used     Comment: smoked socially in college--very occ  . Alcohol use No     Comment: wine  . Drug use: No  . Sexual activity: Yes    Partners: Male    Birth control/ protection: None     Comment: husband vasectomy   Other Topics Concern  . None   Social History Narrative  . None    Additional Social History:   Allergies:   Allergies  Allergen Reactions  . Lamisil [Terbinafine Hcl] Rash    Metabolic Disorder Labs: No results found for: HGBA1C, MPG No results found for: PROLACTIN No results found for: CHOL, TRIG, HDL, CHOLHDL, VLDL, LDLCALC   Current Medications: Current Outpatient Prescriptions  Medication Sig Dispense Refill  . calcium-vitamin D 250-100 MG-UNIT tablet Take 1 tablet by mouth 2 (two) times daily.    . Cholecalciferol (VITAMIN D PO) Take 4,000 Int'l Units by mouth daily.     . clonazePAM (KLONOPIN) 0.5 MG tablet Take 1 tablet (0.5 mg total) by mouth 2 (two) times daily. (Patient taking differently: Take 0.25 mg by mouth 2 (two) times daily. ) 60 tablet 5  . EPINEPHrine (EPIPEN 2-PAK) 0.3 mg/0.3 mL IJ SOAJ injection as needed.    Marland Kitchen esomeprazole (NEXIUM) 40 MG capsule Take 40 mg by mouth every other day.     . ibuprofen (ADVIL,MOTRIN) 600 MG tablet Take 600 mg by mouth.    . latanoprost (XALATAN) 0.005 % ophthalmic solution 1 drop at bedtime.    Marland Kitchen levocetirizine (XYZAL) 5 MG tablet Take 5 mg by mouth every evening.  5  . losartan (COZAAR) 25 MG tablet Take 25 mg by mouth daily.    . Magnesium 250 MG TABS Take by mouth daily.    . mirtazapine (REMERON) 30 MG tablet Take 1 tablet (30 mg total) by mouth at bedtime. 30 tablet 6  . Multiple Vitamins-Minerals (MULTIVITAMIN PO) Take by mouth.    . NON FORMULARY Allergy injections-one in each are weekly    . Omega-3 Fatty  Acids (FISH OIL PO) Take 4,000 mg by mouth daily.    . Probiotic Product (PROBIOTIC DAILY PO) Take by mouth. Ultra flora B daily    . risedronate (ACTONEL) 150 MG tablet Take 150 mg by mouth every 30 (thirty) days.    . rosuvastatin (CRESTOR) 20 MG tablet Take 20 mg by mouth daily.    . sodium fluoride-calcium carbonate (FLORICAL) 8.3-364 MG CAPS Take 1 capsule by mouth 2 (two)  times daily.    . tamoxifen (NOLVADEX) 20 MG tablet Take 20 mg by mouth daily.    . clobetasol ointment (TEMOVATE) 8.28 % Apply 1 application topically 2 (two) times daily. (Patient not taking: Reported on 04/02/2017) 60 g 0  . Psyllium Husk POWD by Does not apply route.    . Vitamin D, Ergocalciferol, (DRISDOL) 50000 units CAPS capsule Take 50,000 Units by mouth once a week.  0  . vitamin E (VITAMIN E) 400 UNIT capsule Take 400 Units by mouth daily.     No current facility-administered medications for this visit.     Neurologic: Headache: No Seizure: No Paresthesias:No  Musculoskeletal: Strength & Muscle Tone: within normal limits Gait & Station: normal Patient leans: N/A  Psychiatric Specialty Exam: ROS  Blood pressure (!) 148/70, pulse (!) 109, height 5' 3.5" (1.613 m), weight 126 lb (57.2 kg), last menstrual period 02/05/2009.Body mass index is 21.97 kg/m.  General Appearance: Fairly Groomed  Eye Contact:  Good  Speech:  Clear and Coherent  Volume:  Normal  Mood:  Depressed  Affect:  Appropriate  Thought Process:  Goal Directed  Orientation:  Full (Time, Place, and Person)  Thought Content:  NA  Suicidal Thoughts:  No  Homicidal Thoughts:  No  Memory:  NA  Judgement:  Good  Insight:  Good  Psychomotor Activity:  Normal  Concentration:   Recall:  Good  Fund of Knowledge:Good  Language: Good  Akathisia:  No  Handed:  Right  AIMS (if indicated):    Assets:  Desire for Improvement  ADL's:  Intact  Cognition:   Sleep:      Treatment Plan Summary: At this time the patient will continue in  treatment with Dr. Doroteo Glassman. She'll reduce her Klonopin to take 0.25 mg twice a day which is what she's been taking down to 0.25 mg at night for 2 nights and then discontinue it. In 72 hours she'll be off of the Klonopin. I shared with her that should be no withdrawal from this agent. We'll also reduce her Remeron down to 15 mg which will hopefully help her appetite. The patient return to see me in 5 weeks. Between that time she'll have an MRI testing which I suspect will show nothing new and neuropsych testing which will be the most revealing. The patient and friend asked if something could replace the Klonopin for her anxiety. At this time I recommended that we first give her some time off the Klonopin and to be evaluated before we consider a different anxiety agent. If he would most likely be that of BuSpar.   Jerral Ralph, MD 4/26/20182:09 PM

## 2017-04-11 ENCOUNTER — Ambulatory Visit (HOSPITAL_BASED_OUTPATIENT_CLINIC_OR_DEPARTMENT_OTHER)
Admission: RE | Admit: 2017-04-11 | Discharge: 2017-04-11 | Disposition: A | Discharge status: Home / Self Care | Payer: Medicare Other | Source: Ambulatory Visit | Attending: Neurology | Admitting: Neurology

## 2017-04-11 DIAGNOSIS — R413 Other amnesia: Secondary | ICD-10-CM | POA: Diagnosis present

## 2017-04-11 MED ORDER — GADOBENATE DIMEGLUMINE 529 MG/ML IV SOLN
10.0000 mL | Freq: Once | INTRAVENOUS | Status: AC | PRN
  Administered 2017-04-11: 10 mL via INTRAVENOUS

## 2017-04-20 ENCOUNTER — Telehealth (HOSPITAL_COMMUNITY): Payer: Self-pay

## 2017-04-20 NOTE — Telephone Encounter (Signed)
Patient called and said she has come off the Klonopin as you requested and has decreased her Temazepam from 30 to 15 mg. She said that now she is waking up 2-3 times a night and her anxiety is getting worse. She knows that you do not want her on these medications due to her neuropsych testing, but would like to know if there is anything else she can take. Please review and advise, thank you

## 2017-04-23 ENCOUNTER — Other Ambulatory Visit (HOSPITAL_COMMUNITY): Payer: Self-pay

## 2017-04-23 MED ORDER — BUSPIRONE HCL 10 MG PO TABS: ORAL_TABLET | ORAL | 0 refills | Status: DC

## 2017-04-23 MED ORDER — SUVOREXANT 10 MG PO TABS: 10.0000 mg | ORAL_TABLET | Freq: Every day | ORAL | 2 refills | Status: DC

## 2017-05-13 ENCOUNTER — Encounter (HOSPITAL_COMMUNITY): Payer: Self-pay | Admitting: Psychiatry

## 2017-05-13 ENCOUNTER — Ambulatory Visit (INDEPENDENT_AMBULATORY_CARE_PROVIDER_SITE_OTHER): Payer: Medicare Other | Admitting: Psychiatry

## 2017-05-13 VITALS — BP 128/76 | HR 90 | Ht 63.5 in | Wt 119.8 lb

## 2017-05-13 DIAGNOSIS — F419 Anxiety disorder, unspecified: Secondary | ICD-10-CM | POA: Diagnosis not present

## 2017-05-13 DIAGNOSIS — F329 Major depressive disorder, single episode, unspecified: Secondary | ICD-10-CM

## 2017-05-13 DIAGNOSIS — Z79899 Other long term (current) drug therapy: Secondary | ICD-10-CM | POA: Diagnosis not present

## 2017-05-13 DIAGNOSIS — Z818 Family history of other mental and behavioral disorders: Secondary | ICD-10-CM | POA: Diagnosis not present

## 2017-05-13 DIAGNOSIS — Z888 Allergy status to other drugs, medicaments and biological substances status: Secondary | ICD-10-CM

## 2017-05-13 DIAGNOSIS — F339 Major depressive disorder, recurrent, unspecified: Secondary | ICD-10-CM

## 2017-05-13 DIAGNOSIS — Z791 Long term (current) use of non-steroidal anti-inflammatories (NSAID): Secondary | ICD-10-CM

## 2017-05-13 MED ORDER — BUSPIRONE HCL 15 MG PO TABS: ORAL_TABLET | ORAL | 5 refills | Status: DC

## 2017-05-13 MED ORDER — SERTRALINE HCL 50 MG PO TABS: 50.0000 mg | ORAL_TABLET | Freq: Every day | ORAL | 3 refills | Status: DC

## 2017-05-13 NOTE — Progress Notes (Signed)
Psychiatric Initial Adult Assessment   Patient Identification: Candace Manning MRN:  [id number] Date of Evaluation:  05/13/2017 Referral Source:Dr. Sabra Heck Chief Complaint:   Chief Complaint    Follow-up     Visit Diagnosis: No diagnosis found.   Today the patient is seen with her friend Stanton Kidney. The patient is very anxious and depressed. She's really scared. A close evaluation of memory actually says at times seems to be better. She now and [facility] for a significant period of time. Her neurologist is put her on a very small dose of Xanax 0.25 mg 1 or 2 a day. He also adjust up the BuSpar. She's not really taking that high-dose at all she's taking 3 mg of BuSpar. The patient is sleeping fairly well. She feels exhausted during the day. She's eating fairly well. Her Remeron dose is been adjusted and really hasn't helped her at all. The patient still sees Dr. Doroteo Glassman. The patient used to take Prozac for years and he did very well. Lost affect is. We apparently changed to Wellbutrin but she had too many side effects ultimately Klonopin was helpful for her anxiety but. Been on it for a few months she did have some cognitive changes. Her neuropsych testing is been put off now until about 2 weeks from now..    Associated Signs/Symn Symptoms:  anxiety, (Hypo) Manic Symptoms:   Anxiety Symptoms:   Psychotic Symptoms:   PTSD Symptoms:   Past Psychiatric History: outpatient psychotherapy in treatment with Prozac  Previous Psychotropic Medications: Yes   Substance Abuse History in the last 12 months:  No.  Consequences of Substance Abuse: Negative  Past Medical History:  Past Medical History:  Diagnosis Date  . Bursitis   . Colitis    microscopic, Dr. Wynetta Emery  . Depression   . Elevated cholesterol   . GERD (gastroesophageal reflux disease)   . Headache(784.0)    migraines with dizziness  . Heart murmur   . Hiatal hernia    Dr. Collene Mares  . Mitral valve prolapse   . Osteopenia   .  Ovarian cancer ([place]) 6/15   neg genetic testing  . Shingles 03/2010    Past Surgical History:  Procedure Laterality Date  . ABDOMINAL HYSTERECTOMY    . BREAST BIOPSY  09/1999   left breast-benign  . NASAL SEPTUM SURGERY  1979  . TONSILLECTOMY  1954    Family Psychiatric History:   Family History:  Family History  Problem Relation Age of Onset  . Ovarian cancer Mother 29  . Breast cancer Maternal Grandmother        dx. 34 or younger  . Colon cancer Maternal Aunt        dx. 26s  . Diabetes Maternal Grandfather   . Stroke Maternal Grandfather        +EtOH  . Breast cancer Paternal Aunt        dx. 74 or younger  . Hypertension Father   . Stroke Father   . Other Sister        hx of TAH-BSO  . Colon cancer Maternal Uncle        dx. >50  . Stroke Maternal Uncle   . Depression Paternal Uncle   . Stroke Paternal Grandmother   . Depression Paternal Grandfather   . Breast cancer Maternal Aunt        dx. <50  . Breast cancer Maternal Aunt        dx. >50  . Lung cancer Cousin  dx. <50; smoker  . Stroke Paternal Aunt     Social History:   Social History   Social History  . Marital status: Married    Spouse name: N/A  . Number of children: N/A  . Years of education: N/A   Social History Main Topics  . Smoking status: Never Smoker  . Smokeless tobacco: Never Used     Comment: smoked socially in college--very occ  . Alcohol use No     Comment: wine  . Drug use: No  . Sexual activity: Yes    Partners: Male    Birth control/ protection: None     Comment: husband vasectomy   Other Topics Concern  . None   Social History Narrative  . None    Additional Social History:   Allergies:   Allergies  Allergen Reactions  . Lamisil [Terbinafine Hcl] Rash    Metabolic Disorder Labs: No results found for: HGBA1C, MPG No results found for: PROLACTIN No results found for: CHOL, TRIG, HDL, CHOLHDL, VLDL, LDLCALC   Current Medications: Current Outpatient  Prescriptions  Medication Sig Dispense Refill  . ALPRAZolam (XANAX) 0.25 MG tablet Take 0.25 mg by mouth 3 (three) times daily as needed for anxiety.    . busPIRone (BUSPAR) 15 MG tablet 2   qam   1  At diiner 90 tablet 5  . calcium-vitamin D 250-100 MG-UNIT tablet Take 1 tablet by mouth 2 (two) times daily.    . Cholecalciferol (VITAMIN D PO) Take 4,000 Int'l Units by mouth daily.     Marland Kitchen EPINEPHrine (EPIPEN 2-PAK) 0.3 mg/0.3 mL IJ SOAJ injection as needed.    Marland Kitchen esomeprazole (NEXIUM) 40 MG capsule Take 40 mg by mouth every other day.     . ibuprofen (ADVIL,MOTRIN) 600 MG tablet Take 600 mg by mouth.    . latanoprost (XALATAN) 0.005 % ophthalmic solution 1 drop at bedtime.    Marland Kitchen levocetirizine (XYZAL) 5 MG tablet Take 5 mg by mouth every evening.  5  . losartan (COZAAR) 25 MG tablet Take 25 mg by mouth daily.    . Magnesium 250 MG TABS Take by mouth daily.    . mirtazapine (REMERON) 30 MG tablet Take 1 tablet (30 mg total) by mouth at bedtime. 30 tablet 6  . Multiple Vitamins-Minerals (MULTIVITAMIN PO) Take by mouth.    . Omega-3 Fatty Acids (FISH OIL PO) Take 4,000 mg by mouth daily.    . Probiotic Product (PROBIOTIC DAILY PO) Take by mouth. Ultra flora B daily    . rosuvastatin (CRESTOR) 20 MG tablet Take 20 mg by mouth daily.    . sodium fluoride-calcium carbonate (FLORICAL) 8.3-364 MG CAPS Take 1 capsule by mouth 2 (two) times daily.    . vitamin E (VITAMIN E) 400 UNIT capsule Take 400 Units by mouth daily.    . clobetasol ointment (TEMOVATE) 0.93 % Apply 1 application topically 2 (two) times daily. (Patient not taking: Reported on 04/02/2017) 60 g 0  . clonazePAM (KLONOPIN) 0.5 MG tablet Take 1 tablet (0.5 mg total) by mouth 2 (two) times daily. (Patient not taking: Reported on 05/13/2017) 60 tablet 5  . NON FORMULARY Allergy injections-one in each are weekly    . Psyllium Husk POWD by Does not apply route.    . risedronate (ACTONEL) 150 MG tablet Take 150 mg by mouth every 30 (thirty) days.     Marland Kitchen sertraline (ZOLOFT) 50 MG tablet Take 1 tablet (50 mg total) by mouth daily. 30 tablet 3  .  tamoxifen (NOLVADEX) 20 MG tablet Take 20 mg by mouth daily.    . Vitamin D, Ergocalciferol, (DRISDOL) 50000 units CAPS capsule Take 50,000 Units by mouth once a week.  0   No current facility-administered medications for this visit.     Neurologic: Headache: No Seizure: No Paresthesias:No  Musculoskeletal: Strength & Muscle Tone: within normal limits Gait & Station: normal Patient leans: N/A  Psychiatric Specialty Exam: ROS  Blood pressure 128/76, pulse 90, height 5' 3.5" (1.613 m), weight 119 lb 12.8 oz (54.3 kg), last menstrual period 02/05/2009, SpO2 98 %.Body mass index is 20.89 kg/m.  General Appearance: Fairly Groomed  Eye Contact:  Good  Speech:  Clear and Coherent  Volume:  Normal  Mood:  Depressed  Affect:  Appropriate  Thought Process:  Goal Directed  Orientation:  Full (Time, Place, and Person)  Thought Content:  NA  Suicidal Thoughts:  No  Homicidal Thoughts:  No  Memory:  NA  Judgement:  Good  Insight:  Good  Psychomotor Activity:  Normal  Concentration:   Recall:  Good  Fund of Knowledge:Good  Language: Good  Akathisia:  No  Handed:  Right  AIMS (if indicated):    Assets:  Desire for Improvement  ADL's:  Intact  Cognition:   Sleep:      Treatment Plan Summary: Today the patient be asked to adjust her BuSpar 15 mg taking 2 in the morning and one at night easily total 45 mg. Her next visit we shall consider the possibility of increasing it to the maximum dose of 65 mg. At this time however will go ahead and start her on Zoloft 50 mg morning. With her to discontinue her Remeron completely. The patient will have her neuropsych testing is about begin will continue seeing Dr. Doroteo Glassman. She'll return to see me for a quick visit in about 6 weeks. The patient is not suicidal. She clearly is very afflicted by was happen. It seems like she has slow process.   Jerral Ralph, MD 6/6/20183:35 PM

## 2017-05-20 ENCOUNTER — Other Ambulatory Visit (HOSPITAL_COMMUNITY): Payer: Self-pay | Admitting: Psychiatry

## 2017-06-24 ENCOUNTER — Ambulatory Visit (HOSPITAL_COMMUNITY): Payer: Self-pay | Admitting: Psychiatry

## 2017-08-24 ENCOUNTER — Other Ambulatory Visit: Payer: Self-pay | Admitting: Family Medicine

## 2017-08-24 DIAGNOSIS — Z1239 Encounter for other screening for malignant neoplasm of breast: Secondary | ICD-10-CM

## 2017-08-28 ENCOUNTER — Telehealth: Payer: Self-pay | Admitting: Obstetrics & Gynecology

## 2017-08-28 NOTE — Telephone Encounter (Signed)
Spoke with Candace Manning. Candace Manning states that she is a ovarian cancer survivor. Sees Dr.Boggess for gynecology oncology care. Was going to [facility] for 3 month rechecks. States Dr.Boggess is now practicing in [place] at [place] and she is having to travel every 3 months to have her port flushed and CA 125 checked. "I am traveling 2 hours there and 2 hours back for a 15 minute appointment." Candace Manning states she is due to have her port flushed again in November and due for CA125 in February. Asking if there is any where closer she could have her port flushed so that she is not having to travel so far for short appointments.

## 2017-08-28 NOTE — Telephone Encounter (Signed)
Patient left voicemail requesting to speak to a nurse about a port she has.

## 2017-09-01 NOTE — Telephone Encounter (Signed)
Routing to [place] to see if Dr. Denman George will see this pt locally due to Dr. Marlaine Hind now being so far away.  Can you check and see if this is possible?  Thanks.

## 2017-09-02 ENCOUNTER — Ambulatory Visit
Admission: RE | Admit: 2017-09-02 | Discharge: 2017-09-02 | Disposition: A | Discharge status: Home / Self Care | Payer: Medicare Other | Source: Ambulatory Visit | Attending: Family Medicine | Admitting: Family Medicine

## 2017-09-02 DIAGNOSIS — Z1239 Encounter for other screening for malignant neoplasm of breast: Secondary | ICD-10-CM

## 2017-09-02 NOTE — Telephone Encounter (Signed)
Call to Dr Denman George office, left message to call back to check on care options available at [facility].

## 2017-09-07 NOTE — Telephone Encounter (Signed)
Patient called to check the status of coordinating an appointment to have her "port flushed"   Routing to Triage

## 2017-09-07 NOTE — Telephone Encounter (Signed)
Left message to call Liliya Fullenwider at [phone]. 

## 2017-09-08 ENCOUNTER — Other Ambulatory Visit: Payer: Self-pay | Admitting: Gynecologic Oncology

## 2017-09-08 DIAGNOSIS — C569 Malignant neoplasm of unspecified ovary: Secondary | ICD-10-CM

## 2017-09-08 MED ORDER — SODIUM CHLORIDE 0.9% FLUSH
10.0000 mL | INTRAVENOUS | Status: DC | PRN
  Filled 2017-09-08: qty 10

## 2017-09-08 MED ORDER — ALTEPLASE 2 MG IJ SOLR
2.0000 mg | Freq: Once | INTRAMUSCULAR | Status: DC | PRN
  Filled 2017-09-08: qty 2

## 2017-09-08 MED ORDER — HEPARIN SOD (PORK) LOCK FLUSH 100 UNIT/ML IV SOLN
500.0000 [IU] | Freq: Once | INTRAVENOUS | Status: DC
Start: 2017-09-08 — End: 2017-09-08
  Filled 2017-09-08: qty 5

## 2017-09-08 MED ORDER — HEPARIN SOD (PORK) LOCK FLUSH 100 UNIT/ML IV SOLN
500.0000 [IU] | Freq: Once | INTRAVENOUS | Status: DC
  Filled 2017-09-08: qty 5

## 2017-09-08 NOTE — Telephone Encounter (Signed)
Patient returning your call.

## 2017-09-08 NOTE — Telephone Encounter (Signed)
Spoke with patient. Advised of message as seen below from Lamont Snowball, RN regarding port flushes with [facility] While on the line [place] called the patient. Patient is going to speak with them about scheduling and return call.

## 2017-09-08 NOTE — Telephone Encounter (Signed)
2nd call to [facility]. Left message to call back.  Spoke to [place], nurse at [facility]. They will call see patient and after reviewing her chart, they will have scheduler call her to schedule port flush now, again in December and again in February 2019 with appointment with Dr Denman George.

## 2017-09-09 ENCOUNTER — Telehealth: Payer: Self-pay | Admitting: *Deleted

## 2017-09-09 NOTE — Telephone Encounter (Signed)
Per appointment desk in [place], patient was scheduled for appointments and then called back today and canceled them.   Routing to provider for final review.  Will close encounter.

## 2017-09-09 NOTE — Telephone Encounter (Signed)
Late entry---Patient called yesterday and requested that she have her port flushed here with one of our physicians. Scheduled flush appts and called the patient. Gave the patient the flush appts and appt with Dr. Denman George. Explained to the patient that "to have flushes done here with this faculty she would have to transfer care to one of our physicians." Also explained that "per out policy and procedures this faculty flushes ports every 6 to 8 weeks." Patient stated that "I'm not sure I want to change doctors. I have been with Dr. Marlaine Hind for three years and and a relationship with him." Explained to the patient that "We totally understand that, and this is her decision." Patient will think about this and talk with her family. She will cll back soon with an answer.   Patient called this morning and has decided to stay with Dr. Marlaine Hind. She thanked [place] for our help.

## 2017-12-28 ENCOUNTER — Other Ambulatory Visit (HOSPITAL_COMMUNITY): Payer: Self-pay | Admitting: Neurology

## 2017-12-28 DIAGNOSIS — R413 Other amnesia: Secondary | ICD-10-CM

## 2017-12-28 DIAGNOSIS — F419 Anxiety disorder, unspecified: Secondary | ICD-10-CM

## 2018-01-11 ENCOUNTER — Ambulatory Visit (HOSPITAL_COMMUNITY): Payer: Medicare Other

## 2018-01-12 ENCOUNTER — Encounter (HOSPITAL_COMMUNITY): Payer: Self-pay

## 2018-01-12 ENCOUNTER — Ambulatory Visit (HOSPITAL_COMMUNITY)
Admission: RE | Admit: 2018-01-12 | Discharge: 2018-01-12 | Disposition: A | Discharge status: Home / Self Care | Payer: Medicare Other | Source: Ambulatory Visit | Attending: Neurology | Admitting: Neurology

## 2018-01-12 DIAGNOSIS — F419 Anxiety disorder, unspecified: Secondary | ICD-10-CM | POA: Insufficient documentation

## 2018-01-12 DIAGNOSIS — R413 Other amnesia: Secondary | ICD-10-CM | POA: Insufficient documentation

## 2018-01-12 MED ORDER — FLUDEOXYGLUCOSE F - 18 (FDG) INJECTION
10.0000 | Freq: Once | INTRAVENOUS | Status: AC
  Administered 2018-01-12: 10 via INTRAVENOUS

## 2018-06-14 ENCOUNTER — Other Ambulatory Visit: Payer: Self-pay | Admitting: Family Medicine

## 2018-06-14 DIAGNOSIS — Z1231 Encounter for screening mammogram for malignant neoplasm of breast: Secondary | ICD-10-CM

## 2018-07-01 ENCOUNTER — Encounter (HOSPITAL_COMMUNITY): Payer: Self-pay

## 2018-07-01 ENCOUNTER — Ambulatory Visit (HOSPITAL_COMMUNITY)
Admission: RE | Admit: 2018-07-01 | Discharge: 2018-07-01 | Disposition: A | Discharge status: Home / Self Care | Payer: Medicare Other | Source: Ambulatory Visit | Attending: Family Medicine | Admitting: Family Medicine

## 2018-07-01 DIAGNOSIS — M8580 Other specified disorders of bone density and structure, unspecified site: Secondary | ICD-10-CM | POA: Diagnosis not present

## 2018-07-01 MED ORDER — SODIUM CHLORIDE 0.9 % IV SOLN
INTRAVENOUS | Status: AC
  Administered 2018-07-01: 12:00:00 via INTRAVENOUS

## 2018-07-01 MED ORDER — ZOLEDRONIC ACID 5 MG/100ML IV SOLN
5.0000 mg | Freq: Once | INTRAVENOUS | Status: AC
  Administered 2018-07-01: 5 mg via INTRAVENOUS
  Filled 2018-07-01: qty 100

## 2018-07-01 NOTE — Discharge Instructions (Signed)
Drink plenty of fluids today to provide additional hydration after getting Reclast today.   May take tylenol/motrin if headache occurs or as needed over the next few days.  As some people report having sluggish feeling/body ache after receiving Reclast.   Reclast is a yearly medicine.  It is given at least every 366 days, but no earlier.  So you are receiving today, July 01, 2018.  You may receive again on or after July 03, 2019.  But your doctor will schedule your next appointment.  So please follow up with them to find out when your next appointment will be.     Reclast (Zoledronic Acid) injection (Paget's Disease, Osteoporosis) What is this medicine? ZOLEDRONIC ACID (ZOE le dron ik AS id) lowers the amount of calcium loss from bone. It is used to treat Paget's disease and osteoporosis in women. This medicine may be used for other purposes; ask your health care provider or pharmacist if you have questions. COMMON BRAND NAME(S): Reclast, Zometa What should I tell my health care provider before I take this medicine? They need to know if you have any of these conditions: -aspirin-sensitive asthma -cancer, especially if you are receiving medicines used to treat cancer -dental disease or wear dentures -infection -kidney disease -low levels of calcium in the blood -past surgery on the parathyroid gland or intestines -receiving corticosteroids like dexamethasone or prednisone -an unusual or allergic reaction to zoledronic acid, other medicines, foods, dyes, or preservatives -pregnant or trying to get pregnant -breast-feeding How should I use this medicine? This medicine is for infusion into a vein. It is given by a health care professional in a hospital or clinic setting. Talk to your pediatrician regarding the use of this medicine in children. This medicine is not approved for use in children. Overdosage: If you think you have taken too much of this medicine contact a poison control center or  emergency room at once. NOTE: This medicine is only for you. Do not share this medicine with others. What if I miss a dose? It is important not to miss your dose. Call your doctor or health care professional if you are unable to keep an appointment. What may interact with this medicine? -certain antibiotics given by injection -NSAIDs, medicines for pain and inflammation, like ibuprofen or naproxen -some diuretics like bumetanide, furosemide -teriparatide This list may not describe all possible interactions. Give your health care provider a list of all the medicines, herbs, non-prescription drugs, or dietary supplements you use. Also tell them if you smoke, drink alcohol, or use illegal drugs. Some items may interact with your medicine. What should I watch for while using this medicine? Visit your doctor or health care professional for regular checkups. It may be some time before you see the benefit from this medicine. Do not stop taking your medicine unless your doctor tells you to. Your doctor may order blood tests or other tests to see how you are doing. Women should inform their doctor if they wish to become pregnant or think they might be pregnant. There is a potential for serious side effects to an unborn child. Talk to your health care professional or pharmacist for more information. You should make sure that you get enough calcium and vitamin D while you are taking this medicine. Discuss the foods you eat and the vitamins you take with your health care professional. Some people who take this medicine have severe bone, joint, and/or muscle pain. This medicine may also increase your risk for jaw  problems or a broken thigh bone. Tell your doctor right away if you have severe pain in your jaw, bones, joints, or muscles. Tell your doctor if you have any pain that does not go away or that gets worse. Tell your dentist and dental surgeon that you are taking this medicine. You should not have major  dental surgery while on this medicine. See your dentist to have a dental exam and fix any dental problems before starting this medicine. Take good care of your teeth while on this medicine. Make sure you see your dentist for regular follow-up appointments. What side effects may I notice from receiving this medicine? Side effects that you should report to your doctor or health care professional as soon as possible: -allergic reactions like skin rash, itching or hives, swelling of the face, lips, or tongue -anxiety, confusion, or depression -breathing problems -changes in vision -eye pain -feeling faint or lightheaded, falls -jaw pain, especially after dental work -mouth sores -muscle cramps, stiffness, or weakness -redness, blistering, peeling or loosening of the skin, including inside the mouth -trouble passing urine or change in the amount of urine Side effects that usually do not require medical attention (report to your doctor or health care professional if they continue or are bothersome): -bone, joint, or muscle pain -constipation -diarrhea -fever -hair loss -irritation at site where injected -loss of appetite -nausea, vomiting -stomach upset -trouble sleeping -trouble swallowing -weak or tired This list may not describe all possible side effects. Call your doctor for medical advice about side effects. You may report side effects to FDA at 1-800-FDA-1088. Where should I keep my medicine? This drug is given in a hospital or clinic and will not be stored at home. NOTE: This sheet is a summary. It may not cover all possible information. If you have questions about this medicine, talk to your doctor, pharmacist, or health care provider.  2018 Elsevier/Gold Standard (2014-04-22 14:19:57)

## 2018-09-06 ENCOUNTER — Ambulatory Visit
Admission: RE | Admit: 2018-09-06 | Discharge: 2018-09-06 | Disposition: A | Discharge status: Home / Self Care | Payer: Medicare Other | Source: Ambulatory Visit | Attending: Family Medicine | Admitting: Family Medicine

## 2018-09-06 DIAGNOSIS — Z1231 Encounter for screening mammogram for malignant neoplasm of breast: Secondary | ICD-10-CM

## 2019-07-08 ENCOUNTER — Other Ambulatory Visit: Payer: Self-pay | Admitting: Family Medicine

## 2019-07-08 DIAGNOSIS — Z1231 Encounter for screening mammogram for malignant neoplasm of breast: Secondary | ICD-10-CM

## 2019-07-08 DIAGNOSIS — M8580 Other specified disorders of bone density and structure, unspecified site: Secondary | ICD-10-CM

## 2019-10-12 ENCOUNTER — Other Ambulatory Visit: Payer: Self-pay

## 2019-10-12 ENCOUNTER — Ambulatory Visit
Admission: RE | Admit: 2019-10-12 | Discharge: 2019-10-12 | Disposition: A | Discharge status: Home / Self Care | Payer: Medicare Other | Source: Ambulatory Visit | Attending: Family Medicine | Admitting: Family Medicine

## 2019-10-12 DIAGNOSIS — Z1231 Encounter for screening mammogram for malignant neoplasm of breast: Secondary | ICD-10-CM

## 2019-10-12 DIAGNOSIS — M8580 Other specified disorders of bone density and structure, unspecified site: Secondary | ICD-10-CM

## 2019-12-27 ENCOUNTER — Ambulatory Visit: Payer: Medicare PPO | Attending: Internal Medicine

## 2019-12-27 DIAGNOSIS — Z23 Encounter for immunization: Secondary | ICD-10-CM | POA: Insufficient documentation

## 2019-12-27 NOTE — Progress Notes (Signed)
   Covid-19 Vaccination Clinic  Name:  Candace Manning    MRN: [id number] DOB: 1946-09-03  12/27/2019  [name] was observed post Covid-19 immunization for 15 minutes without incidence. She was provided with Vaccine Information Sheet and instruction to access the V-Safe system.   [name] was instructed to call 911 with any severe reactions post vaccine: Marland Kitchen Difficulty breathing  . Swelling of your face and throat  . A fast heartbeat  . A bad rash all over your body  . Dizziness and weakness    Immunizations Administered    Name Date Dose VIS Date Route   Pfizer COVID-19 Vaccine 12/27/2019  4:28 PM 0.3 mL 11/18/2019 Intramuscular   Manufacturer: [place]   Lot: [id number]   [place]: [id number]

## 2020-01-19 ENCOUNTER — Ambulatory Visit: Payer: Medicare PPO | Attending: Internal Medicine

## 2020-01-19 DIAGNOSIS — Z23 Encounter for immunization: Secondary | ICD-10-CM | POA: Insufficient documentation

## 2020-01-19 NOTE — Progress Notes (Signed)
   Covid-19 Vaccination Clinic  Name:  HENESSEY VANBUREN    MRN: [id number] DOB: [birth year]  01/19/2020  Ms. Lyvers was observed post Covid-19 immunization for 15 minutes without incidence. She was provided with Vaccine Information Sheet and instruction to access the V-Safe system.   Ms. Bones was instructed to call 911 with any severe reactions post vaccine: Marland Kitchen Difficulty breathing  . Swelling of your face and throat  . A fast heartbeat  . A bad rash all over your body  . Dizziness and weakness    Immunizations Administered    Name Date Dose VIS Date Route   Pfizer COVID-19 Vaccine 01/19/2020  1:33 PM 0.3 mL 11/18/2019 Intramuscular   Manufacturer: [place]   Lot: [id number]   [place]: [id number]

## 2020-01-21 ENCOUNTER — Ambulatory Visit: Payer: Medicare Other

## 2020-02-06 DIAGNOSIS — E782 Mixed hyperlipidemia: Secondary | ICD-10-CM | POA: Diagnosis not present

## 2020-02-06 DIAGNOSIS — F3341 Major depressive disorder, recurrent, in partial remission: Secondary | ICD-10-CM | POA: Diagnosis not present

## 2020-02-06 DIAGNOSIS — K219 Gastro-esophageal reflux disease without esophagitis: Secondary | ICD-10-CM | POA: Diagnosis not present

## 2020-02-06 DIAGNOSIS — R945 Abnormal results of liver function studies: Secondary | ICD-10-CM | POA: Diagnosis not present

## 2020-02-06 DIAGNOSIS — F0390 Unspecified dementia without behavioral disturbance: Secondary | ICD-10-CM | POA: Diagnosis not present

## 2020-02-06 DIAGNOSIS — H409 Unspecified glaucoma: Secondary | ICD-10-CM | POA: Diagnosis not present

## 2020-02-06 DIAGNOSIS — I10 Essential (primary) hypertension: Secondary | ICD-10-CM | POA: Diagnosis not present

## 2020-02-06 DIAGNOSIS — M85851 Other specified disorders of bone density and structure, right thigh: Secondary | ICD-10-CM | POA: Diagnosis not present

## 2020-02-07 DIAGNOSIS — R2681 Unsteadiness on feet: Secondary | ICD-10-CM | POA: Diagnosis not present

## 2020-02-07 DIAGNOSIS — J301 Allergic rhinitis due to pollen: Secondary | ICD-10-CM | POA: Diagnosis not present

## 2020-02-07 DIAGNOSIS — J3089 Other allergic rhinitis: Secondary | ICD-10-CM | POA: Diagnosis not present

## 2020-02-07 DIAGNOSIS — J3081 Allergic rhinitis due to animal (cat) (dog) hair and dander: Secondary | ICD-10-CM | POA: Diagnosis not present

## 2020-02-14 DIAGNOSIS — R2681 Unsteadiness on feet: Secondary | ICD-10-CM | POA: Diagnosis not present

## 2020-02-21 DIAGNOSIS — R2681 Unsteadiness on feet: Secondary | ICD-10-CM | POA: Diagnosis not present

## 2020-02-27 DIAGNOSIS — Z87891 Personal history of nicotine dependence: Secondary | ICD-10-CM | POA: Diagnosis not present

## 2020-02-27 DIAGNOSIS — Z79899 Other long term (current) drug therapy: Secondary | ICD-10-CM | POA: Diagnosis not present

## 2020-02-27 DIAGNOSIS — F0391 Unspecified dementia with behavioral disturbance: Secondary | ICD-10-CM | POA: Diagnosis not present

## 2020-02-27 DIAGNOSIS — F329 Major depressive disorder, single episode, unspecified: Secondary | ICD-10-CM | POA: Diagnosis not present

## 2020-02-27 DIAGNOSIS — Z8543 Personal history of malignant neoplasm of ovary: Secondary | ICD-10-CM | POA: Diagnosis not present

## 2020-02-28 DIAGNOSIS — J3089 Other allergic rhinitis: Secondary | ICD-10-CM | POA: Diagnosis not present

## 2020-02-28 DIAGNOSIS — H40023 Open angle with borderline findings, high risk, bilateral: Secondary | ICD-10-CM | POA: Diagnosis not present

## 2020-02-28 DIAGNOSIS — J3081 Allergic rhinitis due to animal (cat) (dog) hair and dander: Secondary | ICD-10-CM | POA: Diagnosis not present

## 2020-02-28 DIAGNOSIS — H04123 Dry eye syndrome of bilateral lacrimal glands: Secondary | ICD-10-CM | POA: Diagnosis not present

## 2020-02-28 DIAGNOSIS — J301 Allergic rhinitis due to pollen: Secondary | ICD-10-CM | POA: Diagnosis not present

## 2020-02-28 DIAGNOSIS — H524 Presbyopia: Secondary | ICD-10-CM | POA: Diagnosis not present

## 2020-02-28 DIAGNOSIS — H2513 Age-related nuclear cataract, bilateral: Secondary | ICD-10-CM | POA: Diagnosis not present

## 2020-03-06 DIAGNOSIS — R2681 Unsteadiness on feet: Secondary | ICD-10-CM | POA: Diagnosis not present

## 2020-03-13 DIAGNOSIS — G20 Parkinson's disease: Secondary | ICD-10-CM | POA: Diagnosis not present

## 2020-03-13 DIAGNOSIS — F0280 Dementia in other diseases classified elsewhere without behavioral disturbance: Secondary | ICD-10-CM | POA: Diagnosis not present

## 2020-03-13 DIAGNOSIS — F329 Major depressive disorder, single episode, unspecified: Secondary | ICD-10-CM | POA: Diagnosis not present

## 2020-03-13 DIAGNOSIS — F064 Anxiety disorder due to known physiological condition: Secondary | ICD-10-CM | POA: Diagnosis not present

## 2020-03-13 DIAGNOSIS — Z79899 Other long term (current) drug therapy: Secondary | ICD-10-CM | POA: Diagnosis not present

## 2020-03-13 DIAGNOSIS — G3183 Dementia with Lewy bodies: Secondary | ICD-10-CM | POA: Diagnosis not present

## 2020-03-20 DIAGNOSIS — J301 Allergic rhinitis due to pollen: Secondary | ICD-10-CM | POA: Diagnosis not present

## 2020-03-20 DIAGNOSIS — J3089 Other allergic rhinitis: Secondary | ICD-10-CM | POA: Diagnosis not present

## 2020-03-20 DIAGNOSIS — J3081 Allergic rhinitis due to animal (cat) (dog) hair and dander: Secondary | ICD-10-CM | POA: Diagnosis not present

## 2020-04-02 DIAGNOSIS — J329 Chronic sinusitis, unspecified: Secondary | ICD-10-CM | POA: Diagnosis not present

## 2020-04-02 DIAGNOSIS — F0391 Unspecified dementia with behavioral disturbance: Secondary | ICD-10-CM | POA: Diagnosis not present

## 2020-04-02 DIAGNOSIS — F329 Major depressive disorder, single episode, unspecified: Secondary | ICD-10-CM | POA: Diagnosis not present

## 2020-04-02 DIAGNOSIS — Z79899 Other long term (current) drug therapy: Secondary | ICD-10-CM | POA: Diagnosis not present

## 2020-04-10 DIAGNOSIS — J3081 Allergic rhinitis due to animal (cat) (dog) hair and dander: Secondary | ICD-10-CM | POA: Diagnosis not present

## 2020-04-10 DIAGNOSIS — J3089 Other allergic rhinitis: Secondary | ICD-10-CM | POA: Diagnosis not present

## 2020-04-10 DIAGNOSIS — J301 Allergic rhinitis due to pollen: Secondary | ICD-10-CM | POA: Diagnosis not present

## 2020-05-03 DIAGNOSIS — J301 Allergic rhinitis due to pollen: Secondary | ICD-10-CM | POA: Diagnosis not present

## 2020-05-03 DIAGNOSIS — J3089 Other allergic rhinitis: Secondary | ICD-10-CM | POA: Diagnosis not present

## 2020-05-03 DIAGNOSIS — J3081 Allergic rhinitis due to animal (cat) (dog) hair and dander: Secondary | ICD-10-CM | POA: Diagnosis not present

## 2020-05-17 DIAGNOSIS — J3081 Allergic rhinitis due to animal (cat) (dog) hair and dander: Secondary | ICD-10-CM | POA: Diagnosis not present

## 2020-05-17 DIAGNOSIS — J3089 Other allergic rhinitis: Secondary | ICD-10-CM | POA: Diagnosis not present

## 2020-05-17 DIAGNOSIS — J301 Allergic rhinitis due to pollen: Secondary | ICD-10-CM | POA: Diagnosis not present

## 2020-05-22 DIAGNOSIS — G3183 Dementia with Lewy bodies: Secondary | ICD-10-CM | POA: Diagnosis not present

## 2020-05-22 DIAGNOSIS — J324 Chronic pansinusitis: Secondary | ICD-10-CM | POA: Diagnosis not present

## 2020-05-28 DIAGNOSIS — G309 Alzheimer's disease, unspecified: Secondary | ICD-10-CM | POA: Diagnosis not present

## 2020-05-28 DIAGNOSIS — F0281 Dementia in other diseases classified elsewhere with behavioral disturbance: Secondary | ICD-10-CM | POA: Diagnosis not present

## 2020-06-18 DIAGNOSIS — R8271 Bacteriuria: Secondary | ICD-10-CM | POA: Diagnosis not present

## 2020-06-18 DIAGNOSIS — F3341 Major depressive disorder, recurrent, in partial remission: Secondary | ICD-10-CM | POA: Diagnosis not present

## 2020-06-18 DIAGNOSIS — F411 Generalized anxiety disorder: Secondary | ICD-10-CM | POA: Diagnosis not present

## 2020-06-18 DIAGNOSIS — E785 Hyperlipidemia, unspecified: Secondary | ICD-10-CM | POA: Diagnosis not present

## 2020-06-18 DIAGNOSIS — G2571 Drug induced akathisia: Secondary | ICD-10-CM | POA: Diagnosis not present

## 2020-06-18 DIAGNOSIS — R4182 Altered mental status, unspecified: Secondary | ICD-10-CM | POA: Diagnosis not present

## 2020-06-18 DIAGNOSIS — C569 Malignant neoplasm of unspecified ovary: Secondary | ICD-10-CM | POA: Diagnosis not present

## 2020-06-18 DIAGNOSIS — F0151 Vascular dementia with behavioral disturbance: Secondary | ICD-10-CM | POA: Diagnosis not present

## 2020-06-18 DIAGNOSIS — Z638 Other specified problems related to primary support group: Secondary | ICD-10-CM | POA: Diagnosis not present

## 2020-06-18 DIAGNOSIS — T43505A Adverse effect of unspecified antipsychotics and neuroleptics, initial encounter: Secondary | ICD-10-CM | POA: Diagnosis not present

## 2020-06-26 DIAGNOSIS — F3341 Major depressive disorder, recurrent, in partial remission: Secondary | ICD-10-CM | POA: Diagnosis not present

## 2020-06-26 DIAGNOSIS — F411 Generalized anxiety disorder: Secondary | ICD-10-CM | POA: Diagnosis not present

## 2020-06-26 DIAGNOSIS — G2571 Drug induced akathisia: Secondary | ICD-10-CM | POA: Diagnosis not present

## 2020-06-26 DIAGNOSIS — Z888 Allergy status to other drugs, medicaments and biological substances status: Secondary | ICD-10-CM | POA: Diagnosis not present

## 2020-06-26 DIAGNOSIS — T43505A Adverse effect of unspecified antipsychotics and neuroleptics, initial encounter: Secondary | ICD-10-CM | POA: Diagnosis not present

## 2020-06-26 DIAGNOSIS — Z79899 Other long term (current) drug therapy: Secondary | ICD-10-CM | POA: Diagnosis not present

## 2020-07-23 DIAGNOSIS — F3341 Major depressive disorder, recurrent, in partial remission: Secondary | ICD-10-CM | POA: Diagnosis not present

## 2020-07-23 DIAGNOSIS — F0281 Dementia in other diseases classified elsewhere with behavioral disturbance: Secondary | ICD-10-CM | POA: Diagnosis not present

## 2020-07-23 DIAGNOSIS — T43505D Adverse effect of unspecified antipsychotics and neuroleptics, subsequent encounter: Secondary | ICD-10-CM | POA: Diagnosis not present

## 2020-07-23 DIAGNOSIS — G2571 Drug induced akathisia: Secondary | ICD-10-CM | POA: Diagnosis not present

## 2020-07-23 DIAGNOSIS — Z636 Dependent relative needing care at home: Secondary | ICD-10-CM | POA: Diagnosis not present

## 2020-07-23 DIAGNOSIS — T43505A Adverse effect of unspecified antipsychotics and neuroleptics, initial encounter: Secondary | ICD-10-CM | POA: Diagnosis not present

## 2020-07-23 DIAGNOSIS — G301 Alzheimer's disease with late onset: Secondary | ICD-10-CM | POA: Diagnosis not present

## 2020-07-23 DIAGNOSIS — Z79899 Other long term (current) drug therapy: Secondary | ICD-10-CM | POA: Diagnosis not present

## 2020-07-26 DIAGNOSIS — G2571 Drug induced akathisia: Secondary | ICD-10-CM | POA: Diagnosis not present

## 2020-07-26 DIAGNOSIS — T43505A Adverse effect of unspecified antipsychotics and neuroleptics, initial encounter: Secondary | ICD-10-CM | POA: Diagnosis not present

## 2020-07-26 DIAGNOSIS — G20 Parkinson's disease: Secondary | ICD-10-CM | POA: Diagnosis not present

## 2020-07-26 DIAGNOSIS — Z888 Allergy status to other drugs, medicaments and biological substances status: Secondary | ICD-10-CM | POA: Diagnosis not present

## 2020-07-26 DIAGNOSIS — F0151 Vascular dementia with behavioral disturbance: Secondary | ICD-10-CM | POA: Diagnosis not present

## 2020-07-26 DIAGNOSIS — F411 Generalized anxiety disorder: Secondary | ICD-10-CM | POA: Diagnosis not present

## 2020-08-01 DIAGNOSIS — G301 Alzheimer's disease with late onset: Secondary | ICD-10-CM | POA: Diagnosis not present

## 2020-08-01 DIAGNOSIS — F0281 Dementia in other diseases classified elsewhere with behavioral disturbance: Secondary | ICD-10-CM | POA: Diagnosis not present

## 2020-08-01 DIAGNOSIS — T43505D Adverse effect of unspecified antipsychotics and neuroleptics, subsequent encounter: Secondary | ICD-10-CM | POA: Diagnosis not present

## 2020-08-01 DIAGNOSIS — I10 Essential (primary) hypertension: Secondary | ICD-10-CM | POA: Diagnosis not present

## 2020-08-01 DIAGNOSIS — Z9181 History of falling: Secondary | ICD-10-CM | POA: Diagnosis not present

## 2020-08-01 DIAGNOSIS — G2571 Drug induced akathisia: Secondary | ICD-10-CM | POA: Diagnosis not present

## 2020-08-02 DIAGNOSIS — F0281 Dementia in other diseases classified elsewhere with behavioral disturbance: Secondary | ICD-10-CM | POA: Diagnosis not present

## 2020-08-02 DIAGNOSIS — Z9181 History of falling: Secondary | ICD-10-CM | POA: Diagnosis not present

## 2020-08-02 DIAGNOSIS — I10 Essential (primary) hypertension: Secondary | ICD-10-CM | POA: Diagnosis not present

## 2020-08-02 DIAGNOSIS — G2571 Drug induced akathisia: Secondary | ICD-10-CM | POA: Diagnosis not present

## 2020-08-02 DIAGNOSIS — G301 Alzheimer's disease with late onset: Secondary | ICD-10-CM | POA: Diagnosis not present

## 2020-08-02 DIAGNOSIS — T43505D Adverse effect of unspecified antipsychotics and neuroleptics, subsequent encounter: Secondary | ICD-10-CM | POA: Diagnosis not present

## 2020-08-03 DIAGNOSIS — C569 Malignant neoplasm of unspecified ovary: Secondary | ICD-10-CM | POA: Diagnosis not present

## 2020-08-16 DIAGNOSIS — Z23 Encounter for immunization: Secondary | ICD-10-CM | POA: Diagnosis not present

## 2020-08-16 DIAGNOSIS — R4689 Other symptoms and signs involving appearance and behavior: Secondary | ICD-10-CM | POA: Diagnosis not present

## 2020-08-16 DIAGNOSIS — R350 Frequency of micturition: Secondary | ICD-10-CM | POA: Diagnosis not present

## 2020-08-27 DIAGNOSIS — Z79899 Other long term (current) drug therapy: Secondary | ICD-10-CM | POA: Diagnosis not present

## 2020-08-27 DIAGNOSIS — F3341 Major depressive disorder, recurrent, in partial remission: Secondary | ICD-10-CM | POA: Diagnosis not present

## 2020-08-27 DIAGNOSIS — T50905A Adverse effect of unspecified drugs, medicaments and biological substances, initial encounter: Secondary | ICD-10-CM | POA: Diagnosis not present

## 2020-08-27 DIAGNOSIS — G2571 Drug induced akathisia: Secondary | ICD-10-CM | POA: Diagnosis not present

## 2020-08-27 DIAGNOSIS — F411 Generalized anxiety disorder: Secondary | ICD-10-CM | POA: Diagnosis not present

## 2020-08-27 DIAGNOSIS — F0151 Vascular dementia with behavioral disturbance: Secondary | ICD-10-CM | POA: Diagnosis not present

## 2020-08-28 DIAGNOSIS — H40023 Open angle with borderline findings, high risk, bilateral: Secondary | ICD-10-CM | POA: Diagnosis not present

## 2020-08-28 DIAGNOSIS — H2513 Age-related nuclear cataract, bilateral: Secondary | ICD-10-CM | POA: Diagnosis not present

## 2020-08-30 DIAGNOSIS — Z9181 History of falling: Secondary | ICD-10-CM | POA: Diagnosis not present

## 2020-08-30 DIAGNOSIS — I10 Essential (primary) hypertension: Secondary | ICD-10-CM | POA: Diagnosis not present

## 2020-08-30 DIAGNOSIS — G301 Alzheimer's disease with late onset: Secondary | ICD-10-CM | POA: Diagnosis not present

## 2020-08-30 DIAGNOSIS — T43505D Adverse effect of unspecified antipsychotics and neuroleptics, subsequent encounter: Secondary | ICD-10-CM | POA: Diagnosis not present

## 2020-08-30 DIAGNOSIS — G2571 Drug induced akathisia: Secondary | ICD-10-CM | POA: Diagnosis not present

## 2020-08-30 DIAGNOSIS — F0281 Dementia in other diseases classified elsewhere with behavioral disturbance: Secondary | ICD-10-CM | POA: Diagnosis not present

## 2021-05-08 ENCOUNTER — Other Ambulatory Visit: Payer: Self-pay | Admitting: Family Medicine

## 2021-05-08 DIAGNOSIS — Z1231 Encounter for screening mammogram for malignant neoplasm of breast: Secondary | ICD-10-CM

## 2021-05-08 DIAGNOSIS — M8580 Other specified disorders of bone density and structure, unspecified site: Secondary | ICD-10-CM

## 2021-11-26 ENCOUNTER — Ambulatory Visit
Admission: RE | Admit: 2021-11-26 | Discharge: 2021-11-26 | Disposition: A | Discharge status: Home / Self Care | Payer: Medicare PPO | Source: Ambulatory Visit | Attending: Family Medicine | Admitting: Family Medicine

## 2021-11-26 DIAGNOSIS — M8580 Other specified disorders of bone density and structure, unspecified site: Secondary | ICD-10-CM

## 2021-11-26 DIAGNOSIS — Z1231 Encounter for screening mammogram for malignant neoplasm of breast: Secondary | ICD-10-CM

## 2023-05-12 ENCOUNTER — Encounter (HOSPITAL_BASED_OUTPATIENT_CLINIC_OR_DEPARTMENT_OTHER): Payer: Self-pay

## 2023-05-12 ENCOUNTER — Emergency Department (HOSPITAL_BASED_OUTPATIENT_CLINIC_OR_DEPARTMENT_OTHER): Payer: Medicare PPO | Admitting: Radiology

## 2023-05-12 ENCOUNTER — Inpatient Hospital Stay (HOSPITAL_BASED_OUTPATIENT_CLINIC_OR_DEPARTMENT_OTHER)
Admission: EM | Admit: 2023-05-12 | Discharge: 2023-05-15 | DRG: 327 | Disposition: A | Discharge status: Home / Self Care | Payer: Medicare PPO | Attending: Surgery | Admitting: Surgery

## 2023-05-12 ENCOUNTER — Other Ambulatory Visit: Payer: Self-pay

## 2023-05-12 ENCOUNTER — Emergency Department (HOSPITAL_BASED_OUTPATIENT_CLINIC_OR_DEPARTMENT_OTHER): Payer: Medicare PPO

## 2023-05-12 DIAGNOSIS — K449 Diaphragmatic hernia without obstruction or gangrene: Secondary | ICD-10-CM | POA: Diagnosis present

## 2023-05-12 DIAGNOSIS — R6510 Systemic inflammatory response syndrome (SIRS) of non-infectious origin without acute organ dysfunction: Secondary | ICD-10-CM | POA: Diagnosis present

## 2023-05-12 DIAGNOSIS — F32A Depression, unspecified: Secondary | ICD-10-CM | POA: Diagnosis present

## 2023-05-12 DIAGNOSIS — R0789 Other chest pain: Secondary | ICD-10-CM

## 2023-05-12 DIAGNOSIS — Z888 Allergy status to other drugs, medicaments and biological substances status: Secondary | ICD-10-CM

## 2023-05-12 DIAGNOSIS — G309 Alzheimer's disease, unspecified: Secondary | ICD-10-CM | POA: Diagnosis present

## 2023-05-12 DIAGNOSIS — E872 Acidosis, unspecified: Secondary | ICD-10-CM | POA: Diagnosis present

## 2023-05-12 DIAGNOSIS — J301 Allergic rhinitis due to pollen: Secondary | ICD-10-CM

## 2023-05-12 DIAGNOSIS — F02C4 Dementia in other diseases classified elsewhere, severe, with anxiety: Secondary | ICD-10-CM | POA: Diagnosis present

## 2023-05-12 DIAGNOSIS — Z833 Family history of diabetes mellitus: Secondary | ICD-10-CM | POA: Diagnosis not present

## 2023-05-12 DIAGNOSIS — K56609 Unspecified intestinal obstruction, unspecified as to partial versus complete obstruction: Secondary | ICD-10-CM

## 2023-05-12 DIAGNOSIS — F418 Other specified anxiety disorders: Secondary | ICD-10-CM | POA: Diagnosis not present

## 2023-05-12 DIAGNOSIS — E876 Hypokalemia: Secondary | ICD-10-CM | POA: Diagnosis present

## 2023-05-12 DIAGNOSIS — E782 Mixed hyperlipidemia: Secondary | ICD-10-CM | POA: Diagnosis not present

## 2023-05-12 DIAGNOSIS — Z79899 Other long term (current) drug therapy: Secondary | ICD-10-CM

## 2023-05-12 DIAGNOSIS — K219 Gastro-esophageal reflux disease without esophagitis: Secondary | ICD-10-CM | POA: Diagnosis present

## 2023-05-12 DIAGNOSIS — Z800 Family history of malignant neoplasm of digestive organs: Secondary | ICD-10-CM

## 2023-05-12 DIAGNOSIS — E785 Hyperlipidemia, unspecified: Secondary | ICD-10-CM | POA: Diagnosis present

## 2023-05-12 DIAGNOSIS — E860 Dehydration: Secondary | ICD-10-CM | POA: Diagnosis present

## 2023-05-12 DIAGNOSIS — Z8041 Family history of malignant neoplasm of ovary: Secondary | ICD-10-CM | POA: Diagnosis not present

## 2023-05-12 DIAGNOSIS — Z803 Family history of malignant neoplasm of breast: Secondary | ICD-10-CM | POA: Diagnosis not present

## 2023-05-12 DIAGNOSIS — Z8249 Family history of ischemic heart disease and other diseases of the circulatory system: Secondary | ICD-10-CM

## 2023-05-12 DIAGNOSIS — F411 Generalized anxiety disorder: Secondary | ICD-10-CM | POA: Diagnosis present

## 2023-05-12 DIAGNOSIS — Z90710 Acquired absence of both cervix and uterus: Secondary | ICD-10-CM

## 2023-05-12 DIAGNOSIS — I10 Essential (primary) hypertension: Secondary | ICD-10-CM | POA: Diagnosis present

## 2023-05-12 DIAGNOSIS — I447 Left bundle-branch block, unspecified: Secondary | ICD-10-CM | POA: Diagnosis present

## 2023-05-12 DIAGNOSIS — M8580 Other specified disorders of bone density and structure, unspecified site: Secondary | ICD-10-CM | POA: Diagnosis present

## 2023-05-12 DIAGNOSIS — Z823 Family history of stroke: Secondary | ICD-10-CM

## 2023-05-12 DIAGNOSIS — Z801 Family history of malignant neoplasm of trachea, bronchus and lung: Secondary | ICD-10-CM

## 2023-05-12 DIAGNOSIS — Z9049 Acquired absence of other specified parts of digestive tract: Secondary | ICD-10-CM | POA: Diagnosis not present

## 2023-05-12 DIAGNOSIS — Z818 Family history of other mental and behavioral disorders: Secondary | ICD-10-CM

## 2023-05-12 DIAGNOSIS — Z8543 Personal history of malignant neoplasm of ovary: Secondary | ICD-10-CM

## 2023-05-12 DIAGNOSIS — K562 Volvulus: Principal | ICD-10-CM | POA: Diagnosis present

## 2023-05-12 DIAGNOSIS — E7800 Pure hypercholesterolemia, unspecified: Secondary | ICD-10-CM | POA: Diagnosis present

## 2023-05-12 DIAGNOSIS — J309 Allergic rhinitis, unspecified: Secondary | ICD-10-CM | POA: Diagnosis present

## 2023-05-12 LAB — COMPREHENSIVE METABOLIC PANEL
ALT: 19 U/L (ref 0–44)
AST: 21 U/L (ref 15–41)
Albumin: 5 g/dL (ref 3.5–5.0)
Alkaline Phosphatase: 67 U/L (ref 38–126)
Anion gap: 16 — ABNORMAL HIGH (ref 5–15)
BUN: 16 mg/dL (ref 8–23)
CO2: 28 mmol/L (ref 22–32)
Calcium: 10.6 mg/dL — ABNORMAL HIGH (ref 8.9–10.3)
Chloride: 97 mmol/L — ABNORMAL LOW (ref 98–111)
Creatinine, Ser: 0.84 mg/dL (ref 0.44–1.00)
GFR, Estimated: >60 mL/min (ref >60)
Glucose, Bld: 144 mg/dL — ABNORMAL HIGH (ref 70–99)
Potassium: 3.6 mmol/L (ref 3.5–5.1)
Sodium: 141 mmol/L (ref 135–145)
Total Bilirubin: 0.9 mg/dL (ref 0.3–1.2)
Total Protein: 8 g/dL (ref 6.5–8.1)

## 2023-05-12 LAB — CBC
HCT: 48.1 % — ABNORMAL HIGH (ref 36.0–46.0)
Hemoglobin: 16.2 g/dL — ABNORMAL HIGH (ref 12.0–15.0)
MCH: 31.1 pg (ref 26.0–34.0)
MCHC: 33.7 g/dL (ref 30.0–36.0)
MCV: 92.3 fL (ref 80.0–100.0)
Platelets: 370 10*3/uL (ref 150–400)
RBC: 5.21 MIL/uL — ABNORMAL HIGH (ref 3.87–5.11)
RDW: 12.8 % (ref 11.5–15.5)
WBC: 18.3 10*3/uL — ABNORMAL HIGH (ref 4.0–10.5)
nRBC: 0 % (ref 0.0–0.2)

## 2023-05-12 LAB — PROTIME-INR
INR: 1 (ref 0.8–1.2)
Prothrombin Time: 13.1 seconds (ref 11.4–15.2)

## 2023-05-12 LAB — LACTIC ACID, PLASMA: Lactic Acid, Venous: 3.2 mmol/L (ref 0.5–1.9)

## 2023-05-12 LAB — CULTURE, BLOOD (ROUTINE X 2)

## 2023-05-12 LAB — MAGNESIUM: Magnesium: 2.1 mg/dL (ref 1.7–2.4)

## 2023-05-12 LAB — TROPONIN I (HIGH SENSITIVITY): Troponin I (High Sensitivity): 2 ng/L (ref <18)

## 2023-05-12 LAB — LIPASE, BLOOD: Lipase: 19 U/L (ref 11–51)

## 2023-05-12 MED ORDER — PANTOPRAZOLE SODIUM 40 MG IV SOLR
INTRAVENOUS | Status: AC
  Filled 2023-05-12: qty 20

## 2023-05-12 MED ORDER — DIATRIZOATE MEGLUMINE & SODIUM 66-10 % PO SOLN
90.0000 mL | Freq: Once | ORAL | Status: AC
  Administered 2023-05-12: 90 mL via NASOGASTRIC
  Filled 2023-05-12 (×2): qty 90

## 2023-05-12 MED ORDER — PANTOPRAZOLE 80MG IVPB - SIMPLE MED
80.0000 mg | Freq: Once | INTRAVENOUS | Status: AC
  Administered 2023-05-12: 80 mg via INTRAVENOUS
  Filled 2023-05-12: qty 100

## 2023-05-12 MED ORDER — ONDANSETRON HCL 4 MG/2ML IJ SOLN
4.0000 mg | Freq: Once | INTRAMUSCULAR | Status: AC
  Administered 2023-05-12: 4 mg via INTRAVENOUS
  Filled 2023-05-12: qty 2

## 2023-05-12 MED ORDER — DONEPEZIL HCL 10 MG PO TABS
5.0000 mg | ORAL_TABLET | Freq: Once | ORAL | Status: AC
  Administered 2023-05-12: 5 mg via ORAL
  Filled 2023-05-12: qty 1

## 2023-05-12 MED ORDER — ONDANSETRON HCL 4 MG/2ML IJ SOLN
4.0000 mg | Freq: Four times a day (QID) | INTRAMUSCULAR | Status: DC | PRN
  Administered 2023-05-15: 4 mg via INTRAVENOUS
  Filled 2023-05-12 (×3): qty 2

## 2023-05-12 MED ORDER — ACETAMINOPHEN 650 MG RE SUPP: 650.0000 mg | Freq: Four times a day (QID) | RECTAL | Status: DC | PRN

## 2023-05-12 MED ORDER — MORPHINE SULFATE (PF) 2 MG/ML IV SOLN
2.0000 mg | INTRAVENOUS | Status: DC | PRN
  Administered 2023-05-12: 2 mg via INTRAVENOUS
  Filled 2023-05-12: qty 1

## 2023-05-12 MED ORDER — LIDOCAINE HCL URETHRAL/MUCOSAL 2 % EX GEL
1.0000 | Freq: Once | CUTANEOUS | Status: DC
  Filled 2023-05-12: qty 11
  Filled 2023-05-12: qty 6

## 2023-05-12 MED ORDER — POTASSIUM CHLORIDE 2 MEQ/ML IV SOLN
INTRAVENOUS | Status: DC
  Filled 2023-05-12 (×3): qty 1000

## 2023-05-12 MED ORDER — LORAZEPAM 2 MG/ML IJ SOLN
1.0000 mg | Freq: Four times a day (QID) | INTRAMUSCULAR | Status: DC | PRN
  Administered 2023-05-13 – 2023-05-15 (×4): 1 mg via INTRAVENOUS
  Filled 2023-05-12 (×5): qty 1

## 2023-05-12 MED ORDER — IOHEXOL 300 MG/ML  SOLN
100.0000 mL | Freq: Once | INTRAMUSCULAR | Status: AC | PRN
  Administered 2023-05-12: 85 mL via INTRAVENOUS

## 2023-05-12 MED ORDER — PANTOPRAZOLE INFUSION (NEW) - SIMPLE MED
8.0000 mg/h | INTRAVENOUS | Status: DC
  Filled 2023-05-12: qty 100

## 2023-05-12 MED ORDER — MORPHINE SULFATE (PF) 4 MG/ML IV SOLN: 4.0000 mg | INTRAVENOUS | Status: DC | PRN

## 2023-05-12 MED ORDER — ACETAMINOPHEN 325 MG PO TABS: 650.0000 mg | ORAL_TABLET | Freq: Four times a day (QID) | ORAL | Status: DC | PRN

## 2023-05-12 MED ORDER — BENZOCAINE 20 % MT AERO
INHALATION_SPRAY | Freq: Once | OROMUCOSAL | Status: AC
  Administered 2023-05-12: 1 via OROMUCOSAL
  Filled 2023-05-12: qty 57

## 2023-05-12 MED ORDER — QUETIAPINE FUMARATE 25 MG PO TABS
150.0000 mg | ORAL_TABLET | Freq: Once | ORAL | Status: AC
  Administered 2023-05-12: 150 mg via ORAL
  Filled 2023-05-12: qty 2

## 2023-05-12 MED ORDER — HYDRALAZINE HCL 20 MG/ML IJ SOLN: 5.0000 mg | INTRAMUSCULAR | Status: DC | PRN

## 2023-05-12 MED ORDER — LACTATED RINGERS IV BOLUS
1000.0000 mL | Freq: Once | INTRAVENOUS | Status: AC
  Administered 2023-05-12: 1000 mL via INTRAVENOUS

## 2023-05-12 MED ORDER — MEMANTINE HCL 10 MG PO TABS
5.0000 mg | ORAL_TABLET | Freq: Once | ORAL | Status: AC
  Administered 2023-05-12: 5 mg via ORAL
  Filled 2023-05-12: qty 1

## 2023-05-12 NOTE — H&P (Signed)
History and Physical      Candace Manning NFA:[id number] DOB: 13-Feb-1946 DOA: 05/12/2023; DOS: 05/12/2023  PCP: Gweneth Dimitri, MD  Patient coming from: home   I have personally briefly reviewed patient's old medical records in [facility]  Chief Complaint: Nausea, vomiting  HPI: Candace Manning is a 77 y.o. female with medical history significant for severe Alzheimer dementia, essential pretension, and ovarian cancer status post right hemicolectomy with reanastomosis, GAD, GERD, hiatal hernia, hyperlipidemia, who is admitted to [facility] on 05/12/2023 by way of transfer from [facility] with concern for gastric volvulus with associated obstruction after presenting from home to the latter facility for evaluation of nausea/vomiting.  In the setting of the patient's advanced dementia, following history is provided by [facility] EDP in addition to chart review.   The patient has been experiencing 2 days of intermittent nausea resulting in least 3 episodes of nonbilious, nonbloody emesis, that at times has had a reported brown appearance.  She has also been complaining of some epigastric discomfort over that timeframe.  Unclear if she continues to produce flatus.  Her history is notable for moderate to large hiatal hernia as well as a history of ovarian cancer status post hysterectomy status post right hemicolectomy with reanastomosis.  Not on any blood thinners as an outpatient, including no aspirin.    [facility] ED Course:  Vital signs in the ED were notable for the following: Temperature max 9.3; heart rate in the range of 70s to 80s; systolic blood pressure 140s to 160s; respiratory rate 18-22, oxygen saturation 93 to 100% on room air.   Labs were notable for the following: CMP notable for the following: Sodium 141, bicarbonate 28, creatinine 0.84, glucose 144, calcium 10.6, otherwise, liver enzymes within normal limits.  Lipase 19.  Magnesium 2.1.  High sensitive troponin I  noted to be 2.  Initial lactic acid 3.2.  CBC notable for white blood cell count 18,300 with hemoglobin 16.2.  INR 1.0.  Urinalysis ordered, others are currently pending for blood cultures x 2 collected.  Per my interpretation, EKG in ED demonstrated the following: Sinus rhythm with left bundle wrench block, rate 90, nonspecific T wave inversion in lead III, and no evidence of ST changes, including no evidence of ST elevation.  Imaging and additional notable ED work-up, per associated formal radiology reads: 2 view chest x-ray shows moderate hiatal hernia, in the absence of any evidence of acute cardiopulmonary process, including no evidence of infiltrate, edema, effusion, or pneumothorax.  CTA chest shows no evidence of acute pulmonary embolism or any evidence of acute cardiopulmonary process, including onset of infiltrate, edema, effusion, or pneumothorax.  CT abdomen/pelvis with contrast shows large hiatal hernia and findings that are suggestive of gastric volvulus with associated obstruction, in the absence of any evidence of abscess or bowel perforation.  EDP at [facility] discussed patient's case with the on-call general surgeon, Dr. Derrell Lolling, Who recommended [facility] admission and conveyed that general surgery will formally consult.  He also requested placement of NG tube, which has been completed at [facility].  While in the ED, the following were administered: Zofran 4 mg IV x 1, lactated Ringer's is a liter bolus, Protonix 80 mg IV followed by initiation of Protonix drip, morphine 4 mg IV x 1.  Subsequently, the patient was admitted to [facility] for further evaluation management of suspected presenting gastric volvulus with associated obstruction, with presenting labs notable for hypercalcemia as well as lactic acidosis.     Review  of Systems: As per HPI otherwise 10 point review of systems negative.   Past Medical History:  Diagnosis Date   Bursitis    Colitis    microscopic, Dr. Laural Benes    Depression    Elevated cholesterol    GERD (gastroesophageal reflux disease)    Headache(784.0)    migraines with dizziness   Heart murmur    Hiatal hernia    Dr. Loreta Ave   Mitral valve prolapse    Osteopenia    Ovarian cancer (HCC) 6/15   neg genetic testing   Shingles 03/2010    Past Surgical History:  Procedure Laterality Date   ABDOMINAL HYSTERECTOMY     BREAST BIOPSY  09/1999   left breast-benign   BREAST EXCISIONAL BIOPSY Left    NASAL SEPTUM SURGERY  1979   TONSILLECTOMY  1954    Social History:  reports that she has never smoked. She has never used smokeless tobacco. She reports that she does not drink alcohol and does not use drugs.   Allergies  Allergen Reactions   Lamisil [Terbinafine] Rash    Family History  Problem Relation Age of Onset   Ovarian cancer Mother 80   Breast cancer Maternal Grandmother        dx. 46 or younger   Colon cancer Maternal Aunt        dx. 60s   Diabetes Maternal Grandfather    Stroke Maternal Grandfather        +EtOH   Breast cancer Paternal Aunt        dx. 50 or younger   Hypertension Father    Stroke Father    Other Sister        hx of TAH-BSO   Colon cancer Maternal Uncle        dx. >50   Stroke Maternal Uncle    Depression Paternal Uncle    Stroke Paternal Grandmother    Depression Paternal Grandfather    Breast cancer Maternal Aunt        dx. <50   Breast cancer Maternal Aunt        dx. >50   Lung cancer Cousin        dx. <50; smoker   Stroke Paternal Aunt     Family history reviewed and not pertinent    Prior to Admission medications   Medication Sig Start Date End Date Taking? Authorizing Provider  ALPRAZolam (XANAX) 0.25 MG tablet Take 0.25 mg by mouth 3 (three) times daily as needed for anxiety.    Lodema Hong, MD  busPIRone (BUSPAR) 15 MG tablet 2   qam   1  At diiner 05/13/17   Plovsky, Earvin Hansen, MD  calcium-vitamin D 250-100 MG-UNIT tablet Take 1 tablet by mouth 2 (two) times daily.    [provider]  Cholecalciferol (VITAMIN D PO) Take 4,000 Int'l Units by mouth daily.     [provider]  clobetasol ointment (TEMOVATE) 0.05 % Apply 1 application topically 2 (two) times daily. Patient not taking: Reported on 04/02/2017 03/27/17   Jerene Bears, MD  clonazePAM (KLONOPIN) 0.5 MG tablet Take 1 tablet (0.5 mg total) by mouth 2 (two) times daily. Patient not taking: Reported on 05/13/2017 12/04/16 12/04/17  Archer Asa, MD  EPINEPHrine (EPIPEN 2-PAK) 0.3 mg/0.3 mL IJ SOAJ injection as needed. 12/12/14   [provider]  esomeprazole (NEXIUM) 40 MG capsule Take 40 mg by mouth daily.     [provider]  ibuprofen (ADVIL,MOTRIN) 600 MG tablet  Take 600 mg by mouth. 06/09/14   [provider]  latanoprost (XALATAN) 0.005 % ophthalmic solution 1 drop at bedtime.    [provider]  levocetirizine (XYZAL) 5 MG tablet Take 5 mg by mouth every evening. 05/12/15   [provider]  losartan (COZAAR) 25 MG tablet Take 25 mg by mouth daily.    [provider]  Magnesium 250 MG TABS Take by mouth daily.    [provider]  mirtazapine (REMERON) 30 MG tablet Take 1 tablet (30 mg total) by mouth at bedtime. 12/04/16 12/04/17  Plovsky, Earvin Hansen, MD  Multiple Vitamins-Minerals (MULTIVITAMIN PO) Take by mouth.    [provider]  NON FORMULARY Allergy injections-one in each are weekly    [provider]  nortriptyline (PAMELOR) 50 MG capsule Take 100 mg by mouth daily.    [provider]  Omega-3 Fatty Acids (FISH OIL PO) Take 4,000 mg by mouth daily.    [provider]  Probiotic Product (PROBIOTIC DAILY PO) Take by mouth. Ultra flora B daily    [provider]  Psyllium Husk POWD by Does not apply route.    [provider]  QUEtiapine (SEROQUEL) 100 MG tablet Take 150 mg by mouth 3 (three) times daily.    [provider]  risedronate (ACTONEL) 150 MG tablet Take 150 mg  by mouth every 30 (thirty) days. 05/03/15   [provider]  risperiDONE (RISPERDAL) 0.5 MG tablet Take 0.5 mg by mouth 2 (two) times daily. 0.5mg  at 0830 and 0.5mg  at 1730    [provider]  rosuvastatin (CRESTOR) 20 MG tablet Take 20 mg by mouth daily.    [provider]  sertraline (ZOLOFT) 50 MG tablet Take 1 tablet (50 mg total) by mouth daily. 05/13/17   Plovsky, Earvin Hansen, MD  sodium fluoride-calcium carbonate (FLORICAL) 8.3-364 MG CAPS Take 1 capsule by mouth 2 (two) times daily.    [provider]  tamoxifen (NOLVADEX) 20 MG tablet Take 20 mg by mouth daily.    [provider]  UNABLE TO FIND Take 10 mg by mouth 2 (two) times daily. Med Name: Menantine 10mg  at 0830 and 1730    [provider]  Vitamin D, Ergocalciferol, (DRISDOL) 50000 units CAPS capsule Take 50,000 Units by mouth once a week. 01/08/17   [provider]  vitamin E (VITAMIN E) 400 UNIT capsule Take 400 Units by mouth daily.    [provider]  vortioxetine HBr (TRINTELLIX) 20 MG TABS tablet Take 20 mg by mouth daily.    [provider]     Objective    Physical Exam: Vitals:   05/12/23 1451 05/12/23 1902 05/12/23 2030 05/12/23 2113  BP: (!) 158/112 (!) 164/84 (!) 146/104 (!) 148/101  Pulse: 88 (!) 52 79 84  Resp: (!) 22 (!) 22 20 18   Temp: 98.7 F (37.1 C) 99.3 F (37.4 C)  98.9 F (37.2 C)  TempSrc: Oral Oral    SpO2: 94% 100% 90% 93%  Weight:      Height:        General: appears to be stated age; alert, confused; GT noted Skin: warm, dry, no rash Head:  AT/[place] Mouth:  Oral mucosa membranes appear dry, normal dentition Neck: supple; trachea midline Heart:  RRR; did not appreciate any M/R/G Lungs: CTAB, did not appreciate any wheezes, rales, or rhonchi Abdomen: Hypoactive bowel sounds; mild distention ; mild tenderness over the epigastrium, in the absence of any associate guarding, rigidity,  or rebound tenderness Vascular: 2+  pedal pulses b/l; 2+ radial pulses b/l Extremities: no peripheral edema, no muscle wasting Neuro: strength and sensation intact in upper and lower extremities b/l   Labs on Admission: I have personally reviewed following labs and imaging studies  CBC: Recent Labs  Lab 05/12/23 1451  WBC 18.3*  HGB 16.2*  HCT 48.1*  MCV 92.3  PLT 370   Basic Metabolic Panel: Recent Labs  Lab 05/12/23 1451  NA 141  K 3.6  CL 97*  CO2 28  GLUCOSE 144*  BUN 16  CREATININE 0.84  CALCIUM 10.6*  MG 2.1   GFR: Estimated Creatinine Clearance: 52.1 mL/min (by C-G formula based on SCr of 0.84 mg/dL). Liver Function Tests: Recent Labs  Lab 05/12/23 1451  AST 21  ALT 19  ALKPHOS 67  BILITOT 0.9  PROT 8.0  ALBUMIN 5.0   Recent Labs  Lab 05/12/23 1451  LIPASE 19   No results for input(s): "AMMONIA" in the last 168 hours. Coagulation Profile: Recent Labs  Lab 05/12/23 2025  INR 1.0   Cardiac Enzymes: No results for input(s): "CKTOTAL", "CKMB", "CKMBINDEX", "TROPONINI" in the last 168 hours. BNP (last 3 results) No results for input(s): "PROBNP" in the last 8760 hours. HbA1C: No results for input(s): "HGBA1C" in the last 72 hours. CBG: No results for input(s): "GLUCAP" in the last 168 hours. Lipid Profile: No results for input(s): "CHOL", "HDL", "LDLCALC", "TRIG", "CHOLHDL", "LDLDIRECT" in the last 72 hours. Thyroid Function Tests: No results for input(s): "TSH", "T4TOTAL", "FREET4", "T3FREE", "THYROIDAB" in the last 72 hours. Anemia Panel: No results for input(s): "VITAMINB12", "FOLATE", "FERRITIN", "TIBC", "IRON", "RETICCTPCT" in the last 72 hours. Urine analysis:    Component Value Date/Time   BILIRUBINUR n 06/01/2015 1349   PROTEINUR n 06/01/2015 1349   UROBILINOGEN negative 06/01/2015 1349   NITRITE n 06/01/2015 1349   LEUKOCYTESUR Negative 06/01/2015 1349    Radiological Exams on Admission: DG Abd Portable 1V-Small Bowel Protocol-Position Verification  Result  Date: 05/12/2023 CLINICAL DATA:  Nasogastric tube placement. EXAM: PORTABLE ABDOMEN - 1 VIEW COMPARISON:  None Available. FINDINGS: A nasogastric tube is seen with its distal end looped within the body of the stomach. The distal tip is seen overlying the right upper quadrant, likely within a large gastric hernia. The bowel gas pattern is normal. No radio-opaque calculi or other significant radiographic abnormality are seen. IMPRESSION: Nasogastric tube positioning, as described above, likely within a large gastric hernia. Electronically Signed   By: Aram Candela M.D.   On: 05/12/2023 20:27   CT Angio Chest Pulmonary Embolism (PE) W or WO Contrast  Result Date: 05/12/2023 CLINICAL DATA:  Nausea and vomiting, chest discomfort, indigestion since yesterday EXAM: CT ANGIOGRAPHY CHEST WITH CONTRAST TECHNIQUE: Multidetector CT imaging of the chest was performed using the standard protocol during bolus administration of intravenous contrast. Multiplanar CT image reconstructions and MIPs were obtained to evaluate the vascular anatomy. RADIATION DOSE REDUCTION: This exam was performed according to the departmental dose-optimization program which includes automated exposure control, adjustment of the mA and/or kV according to patient size and/or use of iterative reconstruction technique. CONTRAST:  85mL OMNIPAQUE IOHEXOL 300 MG/ML  SOLN COMPARISON:  None Available. FINDINGS: Cardiovascular: Satisfactory opacification of the pulmonary arteries to the segmental level. No evidence of pulmonary embolism. Normal heart size. Left and right coronary artery calcifications. No pericardial effusion. Aortic atherosclerosis Mediastinum/Nodes: No enlarged mediastinal, hilar, or axillary lymph nodes. Large hiatal hernia with intrathoracic position of the distal gastric body  and antrum. Fluid distended stomach. Thyroid gland, trachea, and esophagus demonstrate no significant findings. Lungs/Pleura: Scarring and/or compressive  atelectasis associated hiatal hernia. No pleural effusion or pneumothorax. Upper Abdomen: Please see separately reported examination of the abdomen and pelvis. Musculoskeletal: No chest wall abnormality. No acute osseous findings. Review of the MIP images confirms the above findings. IMPRESSION: 1. Negative examination for pulmonary embolism. 2. Large hiatal hernia with intrathoracic position of the distal gastric body and antrum. Fluid distended stomach. 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 3. Coronary artery disease. Aortic Atherosclerosis (ICD10-I70.0). Electronically Signed   By: Jearld Lesch M.D.   On: 05/12/2023 19:19   CT ABDOMEN PELVIS W CONTRAST  Result Date: 05/12/2023 CLINICAL DATA:  Abdominal pain, nausea, vomiting since yesterday EXAM: CT ABDOMEN AND PELVIS WITH CONTRAST TECHNIQUE: Multidetector CT imaging of the abdomen and pelvis was performed using the standard protocol following bolus administration of intravenous contrast. RADIATION DOSE REDUCTION: This exam was performed according to the departmental dose-optimization program which includes automated exposure control, adjustment of the mA and/or kV according to patient size and/or use of iterative reconstruction technique. CONTRAST:  85mL OMNIPAQUE IOHEXOL 300 MG/ML  SOLN COMPARISON:  05/19/2014 FINDINGS: Lower chest: Large hiatal hernia with intrathoracic position of the distal gastric body and antrum, with a fluid distended stomach and displacement of the antrum above the gastroesophageal junction. Please additionally see separately reported examination of the chest. Hepatobiliary: No solid liver abnormality is seen. No gallstones, gallbladder wall thickening, or biliary dilatation. Pancreas: Unremarkable. No pancreatic ductal dilatation or surrounding inflammatory changes. Spleen: Normal in size without significant abnormality.  Adrenals/Urinary Tract: Adrenal glands are unremarkable. Kidneys are normal, without renal calculi, solid lesion, or hydronephrosis. Bladder is unremarkable. Stomach/Bowel: Fluid distended stomach, as above with the distal gastric body and antrum displaced into the thoracic cavity above the gastroesophageal junction. Partial right hemicolectomy and reanastomosis. No evidence of bowel wall thickening, distention, or inflammatory changes. Vascular/Lymphatic: Aortic atherosclerosis. No enlarged abdominal or pelvic lymph nodes. Reproductive: Status post hysterectomy. Other: No abdominal wall hernia or abnormality. No ascites. Musculoskeletal: No acute or significant osseous findings. IMPRESSION: 1. Large hiatal hernia with intrathoracic position of the distal gastric body and antrum, with a fluid distended stomach and displacement of the antrum above the gastroesophageal junction. 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 2. Partial right hemicolectomy and reanastomosis. 3. Status post hysterectomy. Aortic Atherosclerosis (ICD10-I70.0). Electronically Signed   By: Jearld Lesch M.D.   On: 05/12/2023 19:14   DG Chest 2 View  Result Date: 05/12/2023 CLINICAL DATA:  CP EXAM: CHEST - 2 VIEW COMPARISON:  CXR 12/16/22 FINDINGS: No pleural effusion. No pneumothorax. Right heart border is poorly visualized. Normal mediastinal contours. Moderate hiatal hernia. No focal airspace opacity. No radiographically apparent displaced rib fractures degenerative changes in the upper lumbar spine. Vertebral body heights are maintained IMPRESSION: 1. No focal airspace opacity. 2. Moderate hiatal hernia. Electronically Signed   By: Lorenza Cambridge M.D.   On: 05/12/2023 15:48      Assessment/Plan   Principal Problem:   Volvulus (HCC) Active Problems:   GERD (gastroesophageal reflux disease)   SIRS (systemic inflammatory response syndrome) (HCC)   Lactic acidosis   Hypercalcemia    Essential hypertension   GAD (generalized anxiety disorder)   Allergic rhinitis   Hyperlipidemia    #) Gastric volvulus with associated obstruction: In the setting of 2 days of new  onset nausea resulting in episodes of brown appearing emesis, associated with new onset epigastric discomfort, CT abdomen/pelvis shows findings consistent with gastric volvulus and associated obstruction, without associated evidence of abscess or bowel perforation.   EDP at [facility] discussed patient's case with the on-call general surgeon, Dr. Derrell Lolling, Who recommended [facility] admission and conveyed that general surgery will formally consult.  He also requested placement of NG tube, which has been completed at [facility].  Additionally, Dr. Derrell Lolling has ordered small bowel follow-through via Gastrografin, with instructions to clamp NG tube for 1 hour following administration there of following which there will be resumption of low intermittent wall suction.   Plan: General surgery consulted, as above.  Small bowel follow-through per general surgery.  Clamping NG tube for 1 hour following ministration of Gastrografin followed by resumption of low intermittent wall suction.  NPO.  Continue Zydis Ringer's at 100 cc/h.  As needed IV morphine..  IV Zofran.  Monitor strict I's and O's and daily weights.              #) SIRS criteria present: Presentation associated with elevated liver cell count as well as mild tachypnea.  However, in the absence of e/o underlying infection at this time, including, chest x-ray and CTA chest which showed no evidence of acute cardiopulmonary process, including no evidence of infiltrate, criteria for sepsis not currently met.  Of note, urinalysis has been ordered, with result currently pending.  Suspect non-infectious factors contributing to these SIRS findings, including reactive and hemoconcentrating contributions towards presenting leukocytosis in the setting of presenting gastric volvulus  with resultant dehydration as consequence of decline in oral intake as well as increase in GI losses. patient appears hemodynamically stable at this time.  Consequently, will refrain from initiation of IV antibiotics at this time.  Blood cultures x 2 have been collected.  Plan: Repeat CBC with diff in the AM.  Monitor strict I's and O's and daily weights.  Monitor on telemetry. Refraining from IV abx for now, as above.  Check urinalysis.  Continuous IV fluids, as above.  Further evaluation management of presenting gastric volvulus with associate obstruction.              #) Hypercalcemia: Presenting labs reflect serum calcium of 10.6.  Suspect element of dehydration, without overt pharmacologic contribution. Will initiate gentle IVF's, as above, with repeat calcium level in the morning, with consideration for further expansion of work-up if no ensuing improvement in serum calcium level following interval IVF administration.    Plan: LR, as above.  Monitor strict I's&O's, daily weights.  CMP in the morning.  Check serum Mg and Phos levels.               #) Lactic acidosis: Elevated initial lactic acid level of 3.2.  As noted above, in the absence of any overt evidence of underlying factious process, criteria for sepsis are not currently met.  Rather, suspect contributions towards the patient's lactic acidosis stemming from dehydration as a consequence of recent plan and oral intake as well as increase in GI losses in the form of 2 days of intermittent nausea/vomiting.  In the context of suspected presenting gastric volvulus and associated obstruction, differential would include lactic acidosis in the basis of strangulation, although this possibility appears less likely at this time.  On-call general surgery is closely following, as noted above.  Additionally, in the setting of outpatient use of high intensity rosuvastatin, will also add on CPK level.  Plan: Continuous activators,  as  above.  Repeat lactic acid level.  Follow-up result urinalysis.  Check CPK and salicylate level.  Repeat CMP in the morning as well as CBC at that time for further evaluation management presenting volvulus with associated evidence of obstruction, as further detailed above.                #) Essential Hypertension: documented h/o such, with outpatient antihypertensive regimen including losartan.  SBP's in the ED today: 140s to 160s mmHg.   Plan: Close monitoring of subsequent BP via routine VS. hold home losartan will the patient remains NPO.  As needed IV hydralazine for systolic blood pressure greater than 180 mmHg.                 #) Generalized anxiety disorder: documented h/o such. On nortriptyline, BuSpar, and as needed Xanax as outpatient.  The patient's home use of Xanax in the absence of associated paradoxical reaction is noted.  Plan: In the setting of current n.p.o. status we will hold him nortriptyline, BuSpar, and as needed Xanax for now.  Prn IV Ativan for anxiety or agitation.                #) GERD: documented h/o such; on on Nexium as an outpatient, which will be held for now in the setting of current n.p.o. status.  It appears that the patient was started on Protonix drip while at [facility], although no evidence of acute upper gastrointestinal bleed at this time  Plan: continue IV PPI, as above.                  #) Allergic Rhinitis: documented h/o such, on scheduled Xyzal as outpatient.    Plan: In the setting of current n.p.o. status, will hold home Xyzal for now.                 #) Hyperlipidemia: documented h/o such. On high intensity rosuvastatin as outpatient.  In the context of concomitant lactic acidosis, will also add on CPK level.  Plan: Hold home statin for now in the setting of current n.p.o. status.  Add on CPK level, as above.      DVT prophylaxis: SCD's   Code Status: Full code Family  Communication: none Disposition Plan: Per Rounding Team Consults called: [facility] EDP d/w on-call gen surgery (Dr., Derrell Lolling), who will formally consult, as further detailed above;  Admission status: inpatient     I SPENT GREATER THAN 75  MINUTES IN CLINICAL CARE TIME/MEDICAL DECISION-MAKING IN COMPLETING THIS ADMISSION.      Chaney Born Derriona Branscom DO [facility]  From 7PM - 7AM   05/12/2023, 10:45 PM

## 2023-05-12 NOTE — ED Notes (Signed)
Pt continues to vomit coffee ground emesis, NGT placed immediate return of off coffee ground contents.   Pt tolerated procedure well

## 2023-05-12 NOTE — Progress Notes (Signed)
This is a 77 year old female with past medical history of severe Alzheimer's, anxiety and depression, hiatal hernia, and ovarian cancer status post hemicolectomy.  She developed nausea and vomiting yesterday afternoon that this persisted through today.  Her emesis has been brownish in color.  There is some abdominal discomfort.  Patient has severe dementia therefore is is a scant historian.  There is reports of some chest discomfort yesterday that is thought to be indigestion.  She was brought to [facility].  In the ER patient's vitals are within normal limits.  On the second set of vitals, heart rate 52.  She was placed on 2 L nasal cannula however no hypoxia noted. WBC 18.3.  Hemoglobin 16.2.  Tmax 99.3  CT abdomen pelvis shows 1. Large hiatal hernia with intrathoracic position of the distal gastric body and antrum, with a fluid distended stomach and displacement of the antrum above the gastroesophageal junction. 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 2. Partial right hemicolectomy and reanastomosis. 3. Status post hysterectomy.  Surgeon Dr. Derrell Lolling was contacted by EDP, he recommends admission with NG tube placement to low intermittent wall suction.  There is concern for volvulus  Patient accepted for admission to med telemetry. IV morphine, Zofran, IV fluids, blood cultures initiated Lactic acid, magnesium levels added

## 2023-05-12 NOTE — ED Notes (Signed)
Report given to Jeannene Patella RN @ [facility]

## 2023-05-12 NOTE — Progress Notes (Signed)
Pt arrived to unit, mittens placed on, assist to bathroom, informed of POC with family

## 2023-05-12 NOTE — ED Provider Notes (Signed)
[place] EMERGENCY DEPARTMENT AT [facility] Provider Note   CSN: [id number] Arrival date & time: 05/12/23  1438     History  Chief Complaint  Patient presents with   Emesis    Candace Manning is a 77 y.o. female.   Emesis Patient is a 77 year old female with a past medical history significant for ovarian cancer status post hemicolectomy associated with the ovarian surgery, history of hiatal hernia, depression, dementia  Candace Manning is presenting to emergency room today with complaints of brown emesis since yesterday evening around 9 PM.  Her symptoms have been persistent Candace Manning denies any significant pain.  Has had a small amount of chest discomfort no syncope or near syncope.  No blood in emesis.  Stool has been normal no urinary urgency or dysuria or hematuria.  Patient does have significant dementia and may be his questions are being primarily answered by her husband.     Home Medications Prior to Admission medications   Medication Sig Start Date End Date Taking? Authorizing Provider  ALPRAZolam (XANAX) 0.25 MG tablet Take 0.25 mg by mouth 3 (three) times daily as needed for anxiety.    Lodema Hong, MD  busPIRone (BUSPAR) 15 MG tablet 2   qam   1  At diiner 05/13/17   Plovsky, Earvin Hansen, MD  calcium-vitamin D 250-100 MG-UNIT tablet Take 1 tablet by mouth 2 (two) times daily.    [provider]  Cholecalciferol (VITAMIN D PO) Take 4,000 Int'l Units by mouth daily.     [provider]  clobetasol ointment (TEMOVATE) 0.05 % Apply 1 application topically 2 (two) times daily. Patient not taking: Reported on 04/02/2017 03/27/17   Jerene Bears, MD  clonazePAM (KLONOPIN) 0.5 MG tablet Take 1 tablet (0.5 mg total) by mouth 2 (two) times daily. Patient not taking: Reported on 05/13/2017 12/04/16 12/04/17  Archer Asa, MD  EPINEPHrine (EPIPEN 2-PAK) 0.3 mg/0.3 mL IJ SOAJ injection as needed. 12/12/14   [provider]  esomeprazole (NEXIUM) 40 MG capsule Take  40 mg by mouth daily.     [provider]  ibuprofen (ADVIL,MOTRIN) 600 MG tablet Take 600 mg by mouth. 06/09/14   [provider]  latanoprost (XALATAN) 0.005 % ophthalmic solution 1 drop at bedtime.    [provider]  levocetirizine (XYZAL) 5 MG tablet Take 5 mg by mouth every evening. 05/12/15   [provider]  losartan (COZAAR) 25 MG tablet Take 25 mg by mouth daily.    [provider]  Magnesium 250 MG TABS Take by mouth daily.    [provider]  mirtazapine (REMERON) 30 MG tablet Take 1 tablet (30 mg total) by mouth at bedtime. 12/04/16 12/04/17  Plovsky, Earvin Hansen, MD  Multiple Vitamins-Minerals (MULTIVITAMIN PO) Take by mouth.    [provider]  NON FORMULARY Allergy injections-one in each are weekly    [provider]  nortriptyline (PAMELOR) 50 MG capsule Take 100 mg by mouth daily.    [provider]  Omega-3 Fatty Acids (FISH OIL PO) Take 4,000 mg by mouth daily.    [provider]  Probiotic Product (PROBIOTIC DAILY PO) Take by mouth. Ultra flora B daily    [provider]  Psyllium Husk POWD by Does not apply route.    [provider]  QUEtiapine (SEROQUEL) 100 MG tablet Take 150 mg by mouth 3 (three) times daily.    [provider]  risedronate (ACTONEL) 150 MG tablet Take 150 mg by mouth  every 30 (thirty) days. 05/03/15   [provider]  risperiDONE (RISPERDAL) 0.5 MG tablet Take 0.5 mg by mouth 2 (two) times daily. 0.5mg  at 0830 and 0.5mg  at 1730    [provider]  rosuvastatin (CRESTOR) 20 MG tablet Take 20 mg by mouth daily.    [provider]  sertraline (ZOLOFT) 50 MG tablet Take 1 tablet (50 mg total) by mouth daily. 05/13/17   Plovsky, Earvin Hansen, MD  sodium fluoride-calcium carbonate (FLORICAL) 8.3-364 MG CAPS Take 1 capsule by mouth 2 (two) times daily.    [provider]  tamoxifen (NOLVADEX) 20 MG tablet Take 20 mg by mouth  daily.    [provider]  UNABLE TO FIND Take 10 mg by mouth 2 (two) times daily. Med Name: Menantine 10mg  at 0830 and 1730    [provider]  Vitamin D, Ergocalciferol, (DRISDOL) 50000 units CAPS capsule Take 50,000 Units by mouth once a week. 01/08/17   [provider]  vitamin E (VITAMIN E) 400 UNIT capsule Take 400 Units by mouth daily.    [provider]  vortioxetine HBr (TRINTELLIX) 20 MG TABS tablet Take 20 mg by mouth daily.    [provider]      Allergies    Lamisil [terbinafine]    Review of Systems   Review of Systems  Gastrointestinal:  Positive for vomiting.    Physical Exam Updated Vital Signs BP (!) 164/84 (BP Location: Left Arm)   Pulse (!) 52   Temp 99.3 F (37.4 C) (Oral)   Resp (!) 22   Ht 5\' 5"  (1.651 m)   Wt 59 kg   LMP 02/05/2009   SpO2 100%   BMI 21.63 kg/m  Physical Exam Vitals and nursing note reviewed.  Constitutional:      General: Candace Manning is in acute distress.     Comments: Retching 77 year old female producing brown emesis  HENT:     Head: Normocephalic and atraumatic.     Nose: Nose normal.     Mouth/Throat:     Mouth: Mucous membranes are moist.  Eyes:     General: No scleral icterus. Cardiovascular:     Rate and Rhythm: Normal rate and regular rhythm.     Pulses: Normal pulses.     Heart sounds: Normal heart sounds.  Pulmonary:     Effort: Pulmonary effort is normal. No respiratory distress.     Breath sounds: No wheezing.  Abdominal:     Palpations: Abdomen is soft.     Tenderness: There is no abdominal tenderness. There is no guarding or rebound.  Musculoskeletal:     Cervical back: Normal range of motion.     Right lower leg: No edema.     Left lower leg: No edema.  Skin:    General: Skin is warm and dry.     Capillary Refill: Capillary refill takes less than 2 seconds.  Neurological:     Mental Status: Candace Manning is alert. Mental status is at baseline.  Psychiatric:        Mood and  Affect: Mood normal.        Behavior: Behavior normal.     ED Results / Procedures / Treatments   Labs (all labs ordered are listed, but only abnormal results are displayed) Labs Reviewed  COMPREHENSIVE METABOLIC PANEL - Abnormal; Notable for the following components:      Result Value   Chloride 97 (*)    Glucose, Bld 144 (*)    Calcium  10.6 (*)    Anion gap 16 (*)    All other components within normal limits  CBC - Abnormal; Notable for the following components:   WBC 18.3 (*)    RBC 5.21 (*)    Hemoglobin 16.2 (*)    HCT 48.1 (*)    All other components within normal limits  LIPASE, BLOOD  URINALYSIS, ROUTINE W REFLEX MICROSCOPIC  TROPONIN I (HIGH SENSITIVITY)    EKG EKG Interpretation  Date/Time:  Tuesday May 12 2023 15:00:21 EDT Ventricular Rate:  90 PR Interval:  160 QRS Duration: 136 QT Interval:  424 QTC Calculation: 518 R Axis:   105 Text Interpretation: Normal sinus rhythm Possible Left atrial enlargement Rightward axis Left bundle branch block Left ventricular hypertrophy with QRS widening ( Romhilt-Estes ) T wave abnormality, consider inferolateral ischemia Abnormal ECG No previous ECGs available Confirmed by Ernie Avena ([id number]) on 05/12/2023 6:10:23 PM  Radiology CT Angio Chest Pulmonary Embolism (PE) W or WO Contrast  Result Date: 05/12/2023 CLINICAL DATA:  Nausea and vomiting, chest discomfort, indigestion since yesterday EXAM: CT ANGIOGRAPHY CHEST WITH CONTRAST TECHNIQUE: Multidetector CT imaging of the chest was performed using the standard protocol during bolus administration of intravenous contrast. Multiplanar CT image reconstructions and MIPs were obtained to evaluate the vascular anatomy. RADIATION DOSE REDUCTION: This exam was performed according to the departmental dose-optimization program which includes automated exposure control, adjustment of the mA and/or kV according to patient size and/or use of iterative reconstruction technique. CONTRAST:   85mL OMNIPAQUE IOHEXOL 300 MG/ML  SOLN COMPARISON:  None Available. FINDINGS: Cardiovascular: Satisfactory opacification of the pulmonary arteries to the segmental level. No evidence of pulmonary embolism. Normal heart size. Left and right coronary artery calcifications. No pericardial effusion. Aortic atherosclerosis Mediastinum/Nodes: No enlarged mediastinal, hilar, or axillary lymph nodes. Large hiatal hernia with intrathoracic position of the distal gastric body and antrum. Fluid distended stomach. Thyroid gland, trachea, and esophagus demonstrate no significant findings. Lungs/Pleura: Scarring and/or compressive atelectasis associated hiatal hernia. No pleural effusion or pneumothorax. Upper Abdomen: Please see separately reported examination of the abdomen and pelvis. Musculoskeletal: No chest wall abnormality. No acute osseous findings. Review of the MIP images confirms the above findings. IMPRESSION: 1. Negative examination for pulmonary embolism. 2. Large hiatal hernia with intrathoracic position of the distal gastric body and antrum. Fluid distended stomach. 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 3. Coronary artery disease. Aortic Atherosclerosis (ICD10-I70.0). Electronically Signed   By: Jearld Lesch M.D.   On: 05/12/2023 19:19   CT ABDOMEN PELVIS W CONTRAST  Result Date: 05/12/2023 CLINICAL DATA:  Abdominal pain, nausea, vomiting since yesterday EXAM: CT ABDOMEN AND PELVIS WITH CONTRAST TECHNIQUE: Multidetector CT imaging of the abdomen and pelvis was performed using the standard protocol following bolus administration of intravenous contrast. RADIATION DOSE REDUCTION: This exam was performed according to the departmental dose-optimization program which includes automated exposure control, adjustment of the mA and/or kV according to patient size and/or use of iterative reconstruction technique.  CONTRAST:  85mL OMNIPAQUE IOHEXOL 300 MG/ML  SOLN COMPARISON:  05/19/2014 FINDINGS: Lower chest: Large hiatal hernia with intrathoracic position of the distal gastric body and antrum, with a fluid distended stomach and displacement of the antrum above the gastroesophageal junction. Please additionally see separately reported examination of the chest. Hepatobiliary: No solid liver abnormality is seen. No gallstones, gallbladder wall thickening, or biliary dilatation. Pancreas: Unremarkable. No pancreatic ductal dilatation or surrounding inflammatory changes.  Spleen: Normal in size without significant abnormality. Adrenals/Urinary Tract: Adrenal glands are unremarkable. Kidneys are normal, without renal calculi, solid lesion, or hydronephrosis. Bladder is unremarkable. Stomach/Bowel: Fluid distended stomach, as above with the distal gastric body and antrum displaced into the thoracic cavity above the gastroesophageal junction. Partial right hemicolectomy and reanastomosis. No evidence of bowel wall thickening, distention, or inflammatory changes. Vascular/Lymphatic: Aortic atherosclerosis. No enlarged abdominal or pelvic lymph nodes. Reproductive: Status post hysterectomy. Other: No abdominal wall hernia or abnormality. No ascites. Musculoskeletal: No acute or significant osseous findings. IMPRESSION: 1. Large hiatal hernia with intrathoracic position of the distal gastric body and antrum, with a fluid distended stomach and displacement of the antrum above the gastroesophageal junction. 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 2. Partial right hemicolectomy and reanastomosis. 3. Status post hysterectomy. Aortic Atherosclerosis (ICD10-I70.0). Electronically Signed   By: Jearld Lesch M.D.   On: 05/12/2023 19:14   DG Chest 2 View  Result Date: 05/12/2023 CLINICAL DATA:  CP EXAM: CHEST - 2 VIEW COMPARISON:  CXR 12/16/22 FINDINGS: No pleural effusion. No pneumothorax.  Right heart border is poorly visualized. Normal mediastinal contours. Moderate hiatal hernia. No focal airspace opacity. No radiographically apparent displaced rib fractures degenerative changes in the upper lumbar spine. Vertebral body heights are maintained IMPRESSION: 1. No focal airspace opacity. 2. Moderate hiatal hernia. Electronically Signed   By: Lorenza Cambridge M.D.   On: 05/12/2023 15:48    Procedures .Critical Care  Performed by: Gailen Shelter, PA Authorized by: Gailen Shelter, PA   Critical care provider statement:    Critical care time (minutes):  35   Critical care time was exclusive of:  Separately billable procedures and treating other patients and teaching time   Critical care was necessary to treat or prevent imminent or life-threatening deterioration of the following conditions: surgical emergency - volvulus.   Critical care was time spent personally by me on the following activities:  Development of treatment plan with patient or surrogate, review of old charts, re-evaluation of patient's condition, pulse oximetry, ordering and review of radiographic studies, ordering and review of laboratory studies, ordering and performing treatments and interventions, obtaining history from patient or surrogate, examination of patient and evaluation of patient's response to treatment   Care discussed with: admitting provider       Medications Ordered in ED Medications  diatrizoate meglumine-sodium (GASTROGRAFIN) 66-10 % solution 90 mL (has no administration in time range)  ondansetron (ZOFRAN) injection 4 mg (has no administration in time range)  lactated ringers bolus 1,000 mL (0 mLs Intravenous Stopped 05/12/23 1949)  ondansetron (ZOFRAN) injection 4 mg (4 mg Intravenous Given 05/12/23 1814)  iohexol (OMNIPAQUE) 300 MG/ML solution 100 mL (85 mLs Intravenous Contrast Given 05/12/23 1843)    ED Course/ Medical Decision Making/ A&P Clinical Course as of 05/12/23 1950  Tue May 12, 2023   1806 8/9 pm Candace Manning began vomiting (felt nauseated before).  [WF]  1947 Discussed with Dr. Derrell Lolling of general surgery who recommends NG tube and admission. [WF]    Clinical Course User Index [WF] Gailen Shelter, PA                             Medical Decision Making Amount and/or Complexity of Data Reviewed Labs: ordered. Radiology: ordered.  Risk Prescription drug management. Decision regarding hospitalization.   This patient presents to the ED for concern of emesis, this involves a number  of treatment options, and is a complaint that carries with it a moderate risk of complications and morbidity. A differential diagnosis was considered for the patient's symptoms which is discussed below:   The emergent differential diagnosis for vomiting includes, but is not limited to ACS/MI, Boerhaave's, DKA, Intracranial Hemorrhage, Ischemic bowel, Meningitis, Sepsis, Acute gastric dilation, Acetaminophen toxicity, Adrenal insufficiency, Appendicitis, Aspirin toxicity, Bowel obstruction/ileus, Cholecystitis, CNS tumor. Electrolyte abnormalities, Elevated ICP, Gastric outlet obstruction, Hyperemesis gravidarum, Pancreatitis, Peritonitis, Ruptured viscus, Testicular torsion/ovarian torsion, Biliary colic, Cannabinoid hyperemesis syndrome, Disulfiram effect, ETOH, Gastritis, Gastroenteritis, Gastroparesis, Hepatitis, Ibuprofen, Labyrinthitis, Migraine, Motion sickness, Narcotic withdrawal, Thyroid, Pregnancy, Peptic ulcer disease, Renal colic, and UTI    Co morbidities: Discussed in HPI   Brief History:  Patient is a 77 year old female with a past medical history significant for ovarian cancer status post hemicolectomy associated with the ovarian surgery, history of hiatal hernia, depression, dementia  Candace Manning is presenting to emergency room today with complaints of brown emesis since yesterday evening around 9 PM.  Her symptoms have been persistent Candace Manning denies any significant pain.  Has had a small  amount of chest discomfort no syncope or near syncope.  No blood in emesis.  Stool has been normal no urinary urgency or dysuria or hematuria.  Patient does have significant dementia and may be his questions are being primarily answered by her husband.    EMR reviewed including pt PMHx, past surgical history and past visits to ER.   See HPI for more details   Lab Tests:   I ordered and independently interpreted labs. Labs notable for Leukocytosis of 18.3, erythrocytosis and platelets upper end of normal consistent with hemoconcentration likely from significant dehydration consistent patient has forceful vomiting.  Creatinine at baseline.  Electrolytes grossly normal.  Troponin x 1 within normal limits lipase normal  Imaging Studies:  Abnormal findings. I personally reviewed all imaging studies. Imaging notable for volvulus.   IMPRESSION:  1. Negative examination for pulmonary embolism.  2. Large hiatal hernia with intrathoracic position of the distal  gastric body and antrum. Fluid distended stomach. 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  3. Coronary artery disease.   IMPRESSION:  1. Large hiatal hernia with intrathoracic position of the distal  gastric body and antrum, with a fluid distended stomach and  displacement of the antrum above the gastroesophageal junction.  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  2. Partial right hemicolectomy and reanastomosis.  3. Status post hysterectomy.    Cardiac Monitoring:  The patient was maintained on a cardiac monitor.  I personally viewed and interpreted the cardiac monitored which showed an underlying rhythm of: NSR EKG non-ischemic normal sinus rhythm with bundle branch block   Medicines ordered:  I ordered medication including 1L LR zofran  for vomiting and  dehydration. Reevaluation of the patient after these medicines showed that the patient improved I have reviewed the patients home medicines and have made adjustments as needed   Critical Interventions:     Consults/Attending Physician   I requested consultation with Dr. Derrell Lolling of general surgery,  and discussed lab and imaging findings as well as pertinent plan - they recommend: NG tube and admission to medicine   Discussed with [place] of hospitalist service who will admit  Reevaluation:  After the interventions noted above I re-evaluated patient and found that they have :improved   Social Determinants of Health:  Problem List / ED Course:  Volvulus with brown emesis.  Candace Manning is actually much more comfortable after 1 dose of Zofran.  NG tube will be placed by bedside RN here at [facility].  Small bowel protocol used.  Discussed with Dr. Derrell Lolling who recommended hospitalist admission. Dehydration 2/2 above  Coffee ground emesis in NG tube w some BRB -- Protonix added.    Dispostion:  After consideration of the diagnostic results and the patients response to treatment, I feel that the patent would benefit from admission  Final Clinical Impression(s) / ED Diagnoses Final diagnoses:  Small bowel obstruction (HCC)  Volvulus [facility])    Rx / DC Orders ED Discharge Orders     None         Gailen Shelter, [place] 05/12/23 2015    Ernie Avena, MD 05/12/23 2039

## 2023-05-12 NOTE — ED Triage Notes (Signed)
Patient here POV from Home.  Endorses N/V that began at 2100 yesterday. No Diarrhea. No known Fevers.   Noted Some Chest Discomfort yesterday believed to be Indigestion. Some ABD Discomfort as well.   NAD Noted during Triage. Ambulatory.

## 2023-05-13 ENCOUNTER — Encounter (HOSPITAL_COMMUNITY): Payer: Self-pay | Admitting: Family Medicine

## 2023-05-13 DIAGNOSIS — K562 Volvulus: Secondary | ICD-10-CM | POA: Diagnosis not present

## 2023-05-13 LAB — CBC WITH DIFFERENTIAL/PLATELET
Abs Immature Granulocytes: 0.06 10*3/uL (ref 0.00–0.07)
Basophils Absolute: 0 10*3/uL (ref 0.0–0.1)
Basophils Relative: 0 %
Eosinophils Absolute: 0 10*3/uL (ref 0.0–0.5)
Eosinophils Relative: 0 %
HCT: 40.6 % (ref 36.0–46.0)
Hemoglobin: 13.1 g/dL (ref 12.0–15.0)
Immature Granulocytes: 0 %
Lymphocytes Relative: 43 %
Lymphs Abs: 6.2 10*3/uL — ABNORMAL HIGH (ref 0.7–4.0)
MCH: 30.5 pg (ref 26.0–34.0)
MCHC: 32.3 g/dL (ref 30.0–36.0)
MCV: 94.6 fL (ref 80.0–100.0)
Monocytes Absolute: 1 10*3/uL (ref 0.1–1.0)
Monocytes Relative: 7 %
Neutro Abs: 7.2 10*3/uL (ref 1.7–7.7)
Neutrophils Relative %: 50 %
Platelets: 289 10*3/uL (ref 150–400)
RBC: 4.29 MIL/uL (ref 3.87–5.11)
RDW: 13.1 % (ref 11.5–15.5)
Smear Review: ADEQUATE
WBC: 14.5 10*3/uL — ABNORMAL HIGH (ref 4.0–10.5)
nRBC: 0 % (ref 0.0–0.2)

## 2023-05-13 LAB — BASIC METABOLIC PANEL
Anion gap: 16 — ABNORMAL HIGH (ref 5–15)
BUN: 20 mg/dL (ref 8–23)
CO2: 31 mmol/L (ref 22–32)
Calcium: 9.6 mg/dL (ref 8.9–10.3)
Chloride: 95 mmol/L — ABNORMAL LOW (ref 98–111)
Creatinine, Ser: 0.97 mg/dL (ref 0.44–1.00)
GFR, Estimated: >60 mL/min (ref >60)
Glucose, Bld: 108 mg/dL — ABNORMAL HIGH (ref 70–99)
Potassium: 3 mmol/L — ABNORMAL LOW (ref 3.5–5.1)
Sodium: 142 mmol/L (ref 135–145)

## 2023-05-13 LAB — CK: Total CK: 97 U/L (ref 38–234)

## 2023-05-13 LAB — GLUCOSE, CAPILLARY: Glucose-Capillary: 110 mg/dL — ABNORMAL HIGH (ref 70–99)

## 2023-05-13 LAB — PHOSPHORUS: Phosphorus: 5 mg/dL — ABNORMAL HIGH (ref 2.5–4.6)

## 2023-05-13 LAB — SALICYLATE LEVEL: Salicylate Lvl: <7 mg/dL — ABNORMAL LOW (ref 7.0–30.0)

## 2023-05-13 LAB — CULTURE, BLOOD (ROUTINE X 2)

## 2023-05-13 LAB — LACTIC ACID, PLASMA: Lactic Acid, Venous: 1.5 mmol/L (ref 0.5–1.9)

## 2023-05-13 LAB — MAGNESIUM: Magnesium: 2 mg/dL (ref 1.7–2.4)

## 2023-05-13 MED ORDER — OLANZAPINE 10 MG IM SOLR: 5.0000 mg | Freq: Two times a day (BID) | INTRAMUSCULAR | Status: DC | PRN

## 2023-05-13 MED ORDER — SENNOSIDES-DOCUSATE SODIUM 8.6-50 MG PO TABS: 1.0000 | ORAL_TABLET | Freq: Every evening | ORAL | Status: DC | PRN

## 2023-05-13 MED ORDER — PANTOPRAZOLE SODIUM 40 MG IV SOLR
40.0000 mg | Freq: Two times a day (BID) | INTRAVENOUS | Status: DC
  Administered 2023-05-13 – 2023-05-15 (×4): 40 mg via INTRAVENOUS
  Filled 2023-05-13 (×4): qty 10

## 2023-05-13 MED ORDER — MUSCLE RUB 10-15 % EX CREA: 1.0000 | TOPICAL_CREAM | CUTANEOUS | Status: DC | PRN

## 2023-05-13 MED ORDER — DEXTROSE-SODIUM CHLORIDE 5-0.45 % IV SOLN: INTRAVENOUS | Status: DC

## 2023-05-13 MED ORDER — METOPROLOL TARTRATE 5 MG/5ML IV SOLN: 5.0000 mg | INTRAVENOUS | Status: DC | PRN

## 2023-05-13 MED ORDER — POTASSIUM CHLORIDE 10 MEQ/100ML IV SOLN
10.0000 meq | INTRAVENOUS | Status: AC
  Administered 2023-05-13 (×4): 10 meq via INTRAVENOUS
  Filled 2023-05-13 (×4): qty 100

## 2023-05-13 MED ORDER — LORATADINE 10 MG PO TABS: 10.0000 mg | ORAL_TABLET | Freq: Every day | ORAL | Status: DC | PRN

## 2023-05-13 MED ORDER — POTASSIUM CHLORIDE 10 MEQ/100ML IV SOLN
10.0000 meq | INTRAVENOUS | Status: AC
  Administered 2023-05-13 – 2023-05-14 (×2): 10 meq via INTRAVENOUS
  Filled 2023-05-13 (×2): qty 100

## 2023-05-13 MED ORDER — HYDRALAZINE HCL 20 MG/ML IJ SOLN: 10.0000 mg | INTRAMUSCULAR | Status: DC | PRN

## 2023-05-13 MED ORDER — TRAZODONE HCL 50 MG PO TABS: 50.0000 mg | ORAL_TABLET | Freq: Every evening | ORAL | Status: DC | PRN

## 2023-05-13 MED ORDER — SALINE SPRAY 0.65 % NA SOLN: 1.0000 | NASAL | Status: DC | PRN

## 2023-05-13 MED ORDER — IPRATROPIUM-ALBUTEROL 0.5-2.5 (3) MG/3ML IN SOLN: 3.0000 mL | RESPIRATORY_TRACT | Status: DC | PRN

## 2023-05-13 MED ORDER — HYDROCORTISONE (PERIANAL) 2.5 % EX CREA: 1.0000 | TOPICAL_CREAM | Freq: Four times a day (QID) | CUTANEOUS | Status: DC | PRN

## 2023-05-13 MED ORDER — CEFAZOLIN SODIUM-DEXTROSE 2-4 GM/100ML-% IV SOLN
2.0000 g | Freq: Once | INTRAVENOUS | Status: AC
  Administered 2023-05-14: 2 g via INTRAVENOUS
  Filled 2023-05-13: qty 100

## 2023-05-13 MED ORDER — HYDROCORTISONE 1 % EX CREA: 1.0000 | TOPICAL_CREAM | Freq: Three times a day (TID) | CUTANEOUS | Status: DC | PRN

## 2023-05-13 MED ORDER — GUAIFENESIN 100 MG/5ML PO LIQD: 5.0000 mL | ORAL | Status: DC | PRN

## 2023-05-13 MED ORDER — POLYVINYL ALCOHOL 1.4 % OP SOLN: 1.0000 [drp] | OPHTHALMIC | Status: DC | PRN

## 2023-05-13 MED ORDER — LIP MEDEX EX OINT: 1.0000 | TOPICAL_OINTMENT | CUTANEOUS | Status: DC | PRN

## 2023-05-13 MED ORDER — PHENOL 1.4 % MT LIQD: 1.0000 | OROMUCOSAL | Status: DC | PRN

## 2023-05-13 MED ORDER — ALUM & MAG HYDROXIDE-SIMETH 200-200-20 MG/5ML PO SUSP: 30.0000 mL | ORAL | Status: DC | PRN

## 2023-05-13 NOTE — Progress Notes (Signed)
PROGRESS NOTE    Candace Manning  ZOX:[id number] DOB: 1946/07/07 DOA: 05/12/2023 PCP: Gweneth Dimitri, MD   Brief Narrative:  77 y.o. female with medical history significant for severe Alzheimer dementia, essential pretension, and ovarian cancer status post right hemicolectomy with reanastomosis, GAD, GERD, hiatal hernia, hyperlipidemia, who is admitted to [facility] on 05/12/2023 by way of transfer from [facility] with concern for gastric volvulus with associated obstruction after presenting from home to the latter facility for evaluation of nausea/vomiting.  CT abdomen pelvis was consistent with large hiatal hernia with concerns of gastric volvulus/obstruction.  NG tube was placed, general surgery was consulted.   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  Surgery consulted.  IV fluids, Accu-Cheks, supportive care.  Continue n.p.o. status Family working with general surgery to explore surgical options  Hypokalemia - As needed repletion  Large hiatal hernia - PPI  Moderate dehydration/SIRS Hypercalcemia Lactic acidosis; resolved. - Aggressive IV fluids  Essential hypertension - Home meds on hold.  IV as needed  GERD - PPI  Hyperlipidemia -Home meds on hold  History of Alzheimer's - Supportive care  DVT prophylaxis: SCDs Code Status: Full code Family Communication: Daughter and spouse at bedside Status is: Inpatient On going management for gastric volvulus.  Currently NG tube in place       Diet Orders (From admission, onward)     Start     Ordered   05/12/23 2012  Diet NPO time specified  Diet effective now         05/12/23 2011            Subjective:  Seen at bedside.  No complaints.  Examination:  General exam: Appears calm and comfortable, NG tube in place Respiratory system: Clear to auscultation. Respiratory effort normal. Cardiovascular system: S1 & S2 heard, RRR. No JVD, murmurs, rubs, gallops or clicks. No pedal edema. Gastrointestinal system: Abdomen is nondistended, soft and nontender. No organomegaly or masses felt. Normal bowel sounds heard. Central nervous system: Alert and oriented. No focal neurological deficits. Extremities: Symmetric 5 x 5 power. Skin: No rashes, lesions or ulcers Psychiatry: Judgement and insight appear normal. Mood & affect appropriate.  Objective: Vitals:   05/13/23 0000 05/13/23 0445 05/13/23 0448 05/13/23 0800  BP: (!) 149/89 (!) 142/77  138/76  Pulse: 82 82  80  Resp: 19 17  18   Temp: 98.9 F (37.2 C) 97.7 F (36.5 C)  98.4 F (36.9 C)  TempSrc: Oral   Oral  SpO2: 94% (!) 89% 92% 90%  Weight:      Height:        Intake/Output Summary (Last 24 hours) at 05/13/2023 0805 Last data filed at 05/13/2023 0600 Gross per 24 hour  Intake 741.27 ml  Output 250 ml  Net 491.27 ml   Filed Weights   05/12/23 1449  Weight: 59 kg    Scheduled Meds: Continuous Infusions:  lactated ringers 1,000 mL with potassium chloride 20 mEq infusion 100 mL/hr at 05/13/23 0600    Nutritional status     Body mass index is 21.63 kg/m.  Data Reviewed:   CBC: Recent Labs  Lab 05/12/23 1451  WBC 18.3*  HGB 16.2*  HCT 48.1*  MCV 92.3  PLT 370   Basic Metabolic Panel: Recent Labs  Lab 05/12/23 1451  NA 141  K 3.6  CL 97*  CO2 28  GLUCOSE 144*  BUN 16  CREATININE 0.84  CALCIUM 10.6*  MG 2.1   GFR: Estimated Creatinine Clearance: 52.1 mL/min (by C-G formula based on SCr of 0.84 mg/dL). Liver Function Tests: Recent Labs  Lab 05/12/23 1451  AST 21  ALT 19  ALKPHOS 67  BILITOT 0.9  PROT 8.0  ALBUMIN 5.0   Recent Labs  Lab  05/12/23 1451  LIPASE 19   No results for input(s): "AMMONIA" in the last 168 hours. Coagulation Profile: Recent Labs  Lab 05/12/23 2025  INR 1.0   Cardiac Enzymes: Recent Labs  Lab 05/12/23 2339  CKTOTAL 97   BNP (last 3 results) No results for input(s): "PROBNP" in the last 8760 hours. HbA1C: No results for input(s): "HGBA1C" in the last 72 hours. CBG: No results for input(s): "GLUCAP" in the last 168 hours. Lipid Profile: No results for input(s): "CHOL", "HDL", "LDLCALC", "TRIG", "CHOLHDL", "LDLDIRECT" in the last 72 hours. Thyroid Function Tests: No results for input(s): "TSH", "T4TOTAL", "FREET4", "T3FREE", "THYROIDAB" in the last 72 hours. Anemia Panel: No results for input(s): "VITAMINB12", "FOLATE", "FERRITIN", "TIBC", "IRON", "RETICCTPCT" in the last 72 hours. Sepsis Labs: Recent Labs  Lab 05/12/23 2025 05/12/23 2339  LATICACIDVEN 3.2* 1.5    Recent Results (from the past 240 hour(s))  Culture, blood (Routine X 2) w Reflex to ID Panel     Status: None (Preliminary result)   Collection Time: 05/12/23  8:25 PM   Specimen: BLOOD LEFT ARM  Result Value Ref Range Status   Specimen Description   Final    BLOOD LEFT ARM Performed at [facility], [address], [place], [place] [address]    Special Requests   Final    BOTTLES DRAWN AEROBIC AND ANAEROBIC Blood Culture results may not be optimal due to an inadequate volume of blood received in culture bottles Performed at [facility], [address] [place], [place] [address]    Culture PENDING  Incomplete   Report Status PENDING  Incomplete  Culture, blood (Routine X 2) w Reflex to ID Panel     Status: None (Preliminary result)   Collection Time: 05/12/23  8:25 PM   Specimen: BLOOD RIGHT ARM  Result Value Ref Range Status   Specimen Description   Final    BLOOD RIGHT ARM Performed at [facility], [address], [place], [place] [address]    Special Requests   Final     BOTTLES DRAWN AEROBIC AND ANAEROBIC Blood Culture adequate volume Performed at [facility], [address] [place], [place] [address]    Culture PENDING  Incomplete   Report Status PENDING  Incomplete         Radiology Studies: DG Abd Portable 1V-Small Bowel Protocol-Position Verification  Result Date: 05/12/2023 CLINICAL DATA:  Nasogastric tube placement. EXAM: PORTABLE ABDOMEN - 1 VIEW COMPARISON:  None Available. FINDINGS: A nasogastric tube is seen with its distal end looped within the body of the stomach. The distal tip is seen overlying the right upper quadrant, likely within a large gastric hernia. The bowel gas pattern is normal. No radio-opaque calculi or other significant radiographic abnormality are seen. IMPRESSION: Nasogastric tube positioning, as described above, likely within a large gastric hernia. Electronically Signed   By: Waylan Rocher  Houston M.D.   On: 05/12/2023 20:27   CT Angio Chest Pulmonary Embolism (PE) W or WO Contrast  Result Date: 05/12/2023 CLINICAL DATA:  Nausea and vomiting, chest discomfort, indigestion since yesterday EXAM: CT ANGIOGRAPHY CHEST WITH CONTRAST TECHNIQUE: Multidetector CT imaging of the chest was performed using the standard protocol during bolus administration of intravenous contrast. Multiplanar CT image reconstructions and MIPs were obtained to evaluate the vascular anatomy. RADIATION DOSE REDUCTION: This exam was performed according to the departmental dose-optimization program which includes automated exposure control, adjustment of the mA and/or kV according to patient size and/or use of iterative reconstruction technique. CONTRAST:  85mL OMNIPAQUE IOHEXOL 300 MG/ML  SOLN COMPARISON:  None Available. FINDINGS: Cardiovascular: Satisfactory opacification of the pulmonary arteries to the segmental level. No evidence of pulmonary embolism. Normal heart size. Left and right coronary artery calcifications. No pericardial effusion.  Aortic atherosclerosis Mediastinum/Nodes: No enlarged mediastinal, hilar, or axillary lymph nodes. Large hiatal hernia with intrathoracic position of the distal gastric body and antrum. Fluid distended stomach. Thyroid gland, trachea, and esophagus demonstrate no significant findings. Lungs/Pleura: Scarring and/or compressive atelectasis associated hiatal hernia. No pleural effusion or pneumothorax. Upper Abdomen: Please see separately reported examination of the abdomen and pelvis. Musculoskeletal: No chest wall abnormality. No acute osseous findings. Review of the MIP images confirms the above findings. IMPRESSION: 1. Negative examination for pulmonary embolism. 2. Large hiatal hernia with intrathoracic position of the distal gastric body and antrum. Fluid distended stomach. 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 3. Coronary artery disease. Aortic Atherosclerosis (ICD10-I70.0). Electronically Signed   By: Jearld Lesch M.D.   On: 05/12/2023 19:19   CT ABDOMEN PELVIS W CONTRAST  Result Date: 05/12/2023 CLINICAL DATA:  Abdominal pain, nausea, vomiting since yesterday EXAM: CT ABDOMEN AND PELVIS WITH CONTRAST TECHNIQUE: Multidetector CT imaging of the abdomen and pelvis was performed using the standard protocol following bolus administration of intravenous contrast. RADIATION DOSE REDUCTION: This exam was performed according to the departmental dose-optimization program which includes automated exposure control, adjustment of the mA and/or kV according to patient size and/or use of iterative reconstruction technique. CONTRAST:  85mL OMNIPAQUE IOHEXOL 300 MG/ML  SOLN COMPARISON:  05/19/2014 FINDINGS: Lower chest: Large hiatal hernia with intrathoracic position of the distal gastric body and antrum, with a fluid distended stomach and displacement of the antrum above the gastroesophageal junction. Please additionally  see separately reported examination of the chest. Hepatobiliary: No solid liver abnormality is seen. No gallstones, gallbladder wall thickening, or biliary dilatation. Pancreas: Unremarkable. No pancreatic ductal dilatation or surrounding inflammatory changes. Spleen: Normal in size without significant abnormality. Adrenals/Urinary Tract: Adrenal glands are unremarkable. Kidneys are normal, without renal calculi, solid lesion, or hydronephrosis. Bladder is unremarkable. Stomach/Bowel: Fluid distended stomach, as above with the distal gastric body and antrum displaced into the thoracic cavity above the gastroesophageal junction. Partial right hemicolectomy and reanastomosis. No evidence of bowel wall thickening, distention, or inflammatory changes. Vascular/Lymphatic: Aortic atherosclerosis. No enlarged abdominal or pelvic lymph nodes. Reproductive: Status post hysterectomy. Other: No abdominal wall hernia or abnormality. No ascites. Musculoskeletal: No acute or significant osseous findings. IMPRESSION: 1. Large hiatal hernia with intrathoracic position of the distal gastric body and antrum, with a fluid distended stomach and displacement of the antrum above the gastroesophageal junction. 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 2. Partial right hemicolectomy and reanastomosis. 3. Status post hysterectomy. Aortic Atherosclerosis (ICD10-I70.0). Electronically Signed  By: Jearld Lesch M.D.   On: 05/12/2023 19:14   DG Chest 2 View  Result Date: 05/12/2023 CLINICAL DATA:  CP EXAM: CHEST - 2 VIEW COMPARISON:  CXR 12/16/22 FINDINGS: No pleural effusion. No pneumothorax. Right heart border is poorly visualized. Normal mediastinal contours. Moderate hiatal hernia. No focal airspace opacity. No radiographically apparent displaced rib fractures degenerative changes in the upper lumbar spine. Vertebral body heights are maintained IMPRESSION: 1. No focal airspace opacity. 2.  Moderate hiatal hernia. Electronically Signed   By: Lorenza Cambridge M.D.   On: 05/12/2023 15:48           LOS: 1 day   Time spent= 35 mins    Bret Vanessen Joline Maxcy, MD [facility]  If 7PM-7AM, please contact night-coverage  05/13/2023, 8:05 AM

## 2023-05-13 NOTE — Progress Notes (Signed)
Meds administered, NG tube clamped for 1 hour per provider orders

## 2023-05-13 NOTE — Progress Notes (Signed)
Can I get the Quetiapine 50 mg order

## 2023-05-13 NOTE — Progress Notes (Signed)
At start of shift there is no computer in this patient's room and no available computer on the unit.  Unable to scan patient or medications at this time

## 2023-05-13 NOTE — Progress Notes (Signed)
[facility] Monica Becton NP was informed of patient medication request via chat with no response

## 2023-05-13 NOTE — Progress Notes (Signed)
Husband is requesting that these medications be started as soon as possible Donepezil10 mg HS Lexapro 25 mg QD, Namenda 10 mg BID, Quetiapine 50 mg HS especially the Quetiapine 50 mg she is NPO with NGT. These Medications are listed in Med list

## 2023-05-13 NOTE — Consult Note (Signed)
Consult Note  LATYRA ZEIDLER 04-Sep-1946  [id number].    Requesting MD: Dr. Arlean Hopping Chief Complaint/Reason for Consult: gastric volvulus  HPI:  77 y.o. female with medical history significant for severe Alzheimer dementia, HTN, HLD, GERD, GAD, HH, ovarian cancer who presented to [facility] ED on 6/4 from home with nausea/vomiting. Talked with husband at bedside.  Patient with advanced demential history collected from chart review and discussion with primary team. Symptoms began on the evening of 6/3 as brown emesis. She had approximately 3 episodes of brown emesis total. She denied pain on presentation to ED but family reported some intermittent epigastric pain. Family reported bowel movements had been normal.   Work up in ED significant for CT scan showing large HH with distended stomach and displacement of antrum above GE junction. She was transferred to [facility] and admitted to hospitalist service.   At time of my exam NGT has been placed.  Over a liter has decompressed. She appears comfortable without abdominal pain.  Substance use: none Blood thinners: none Past Surgeries: right hemicolectomy with primary anastomosis for ovarian cancer, abdominal hysterectomy   ROS: Review of Systems  Unable to perform ROS: Dementia    Family History  Problem Relation Age of Onset   Ovarian cancer Mother 91   Breast cancer Maternal Grandmother        dx. 44 or younger   Colon cancer Maternal Aunt        dx. 60s   Diabetes Maternal Grandfather    Stroke Maternal Grandfather        +EtOH   Breast cancer Paternal Aunt        dx. 50 or younger   Hypertension Father    Stroke Father    Other Sister        hx of TAH-BSO   Colon cancer Maternal Uncle        dx. >50   Stroke Maternal Uncle    Depression Paternal Uncle    Stroke Paternal Grandmother    Depression Paternal Grandfather    Breast cancer Maternal Aunt        dx. <50   Breast cancer Maternal Aunt        dx.  >50   Lung cancer Cousin        dx. <50; smoker   Stroke Paternal Aunt     Past Medical History:  Diagnosis Date   Bursitis    Colitis    microscopic, Dr. Laural Benes   Depression    Elevated cholesterol    GERD (gastroesophageal reflux disease)    Headache(784.0)    migraines with dizziness   Heart murmur    Hiatal hernia    Dr. Loreta Ave   Mitral valve prolapse    Osteopenia    Ovarian cancer (HCC) 6/15   neg genetic testing   Shingles 03/2010    Past Surgical History:  Procedure Laterality Date   ABDOMINAL HYSTERECTOMY     BREAST BIOPSY  09/1999   left breast-benign   BREAST EXCISIONAL BIOPSY Left    NASAL SEPTUM SURGERY  1979   TONSILLECTOMY  1954    Social History:  reports that she has never smoked. She has never used smokeless tobacco. She reports that she does not drink alcohol and does not use drugs.  Allergies:  Allergies  Allergen Reactions   Lamisil [Terbinafine] Rash    Medications Prior to Admission  Medication Sig Dispense Refill   ALPRAZolam (XANAX) 0.25 MG tablet  Take 0.25 mg by mouth 3 (three) times daily as needed for anxiety.     busPIRone (BUSPAR) 15 MG tablet 2   qam   1  At diiner 90 tablet 5   calcium-vitamin D 250-100 MG-UNIT tablet Take 1 tablet by mouth 2 (two) times daily.     Cholecalciferol (VITAMIN D PO) Take 4,000 Int'l Units by mouth daily.      clobetasol ointment (TEMOVATE) 0.05 % Apply 1 application topically 2 (two) times daily. (Patient not taking: Reported on 04/02/2017) 60 g 0   clonazePAM (KLONOPIN) 0.5 MG tablet Take 1 tablet (0.5 mg total) by mouth 2 (two) times daily. (Patient not taking: Reported on 05/13/2017) 60 tablet 5   EPINEPHrine (EPIPEN 2-PAK) 0.3 mg/0.3 mL IJ SOAJ injection as needed.     esomeprazole (NEXIUM) 40 MG capsule Take 40 mg by mouth daily.      ibuprofen (ADVIL,MOTRIN) 600 MG tablet Take 600 mg by mouth.     latanoprost (XALATAN) 0.005 % ophthalmic solution 1 drop at bedtime.     levocetirizine (XYZAL) 5 MG  tablet Take 5 mg by mouth every evening.  5   losartan (COZAAR) 25 MG tablet Take 25 mg by mouth daily.     Magnesium 250 MG TABS Take by mouth daily.     mirtazapine (REMERON) 30 MG tablet Take 1 tablet (30 mg total) by mouth at bedtime. 30 tablet 6   Multiple Vitamins-Minerals (MULTIVITAMIN PO) Take by mouth.     NON FORMULARY Allergy injections-one in each are weekly     nortriptyline (PAMELOR) 50 MG capsule Take 100 mg by mouth daily.     Omega-3 Fatty Acids (FISH OIL PO) Take 4,000 mg by mouth daily.     Probiotic Product (PROBIOTIC DAILY PO) Take by mouth. Ultra flora B daily     Psyllium Husk POWD by Does not apply route.     QUEtiapine (SEROQUEL) 100 MG tablet Take 150 mg by mouth 3 (three) times daily.     risedronate (ACTONEL) 150 MG tablet Take 150 mg by mouth every 30 (thirty) days.     risperiDONE (RISPERDAL) 0.5 MG tablet Take 0.5 mg by mouth 2 (two) times daily. 0.5mg  at 0830 and 0.5mg  at 1730     rosuvastatin (CRESTOR) 20 MG tablet Take 20 mg by mouth daily.     sertraline (ZOLOFT) 50 MG tablet Take 1 tablet (50 mg total) by mouth daily. 30 tablet 3   sodium fluoride-calcium carbonate (FLORICAL) 8.3-364 MG CAPS Take 1 capsule by mouth 2 (two) times daily.     tamoxifen (NOLVADEX) 20 MG tablet Take 20 mg by mouth daily.     UNABLE TO FIND Take 10 mg by mouth 2 (two) times daily. Med Name: Menantine 10mg  at 0830 and 1730     Vitamin D, Ergocalciferol, (DRISDOL) 50000 units CAPS capsule Take 50,000 Units by mouth once a week.  0   vitamin E (VITAMIN E) 400 UNIT capsule Take 400 Units by mouth daily.     vortioxetine HBr (TRINTELLIX) 20 MG TABS tablet Take 20 mg by mouth daily.      Blood pressure (!) 142/77, pulse 82, temperature 97.7 F (36.5 C), resp. rate 17, height 5\' 5"  (1.651 m), weight 59 kg, last menstrual period 02/05/2009, SpO2 92 %. Physical Exam: General: pleasant, WD, female who is laying in bed in NAD HEENT: head is normocephalic, atraumatic.  Sclera are  noninjected.  Pupils equal and round. EOMs intact.  Ears and nose  without any masses or lesions.  Mouth is pink and moist Heart: regular, rate, and rhythm.  Normal s1,s2. No obvious murmurs, gallops, or rubs noted.  Palpable radial and pedal pulses bilaterally Lungs: CTAB, no wheezes, rhonchi, or rales noted.  Respiratory effort nonlabored Abd: soft, NT, ND, +BS, no masses, hernias, or organomegaly, NGT in place with dark fluid output MSK: all 4 extremities are symmetrical with no cyanosis, clubbing, or edema. Skin: warm and dry with no masses, lesions, or rashes Neuro: Cranial nerves 2-12 grossly intact, sensation is normal throughout Psych:Resting   Results for orders placed or performed during the hospital encounter of 05/12/23 (from the past 48 hour(s))  Lipase, blood     Status: None   Collection Time: 05/12/23  2:51 PM  Result Value Ref Range   Lipase 19 11 - 51 U/L    Comment: Performed at [facility], [address], [place], [place] [address]  Comprehensive metabolic panel     Status: Abnormal   Collection Time: 05/12/23  2:51 PM  Result Value Ref Range   Sodium 141 135 - 145 mmol/L   Potassium 3.6 3.5 - 5.1 mmol/L   Chloride 97 (L) 98 - 111 mmol/L   CO2 28 22 - 32 mmol/L   Glucose, Bld 144 (H) 70 - 99 mg/dL    Comment: Glucose reference range applies only to samples taken after fasting for at least 8 hours.   BUN 16 8 - 23 mg/dL   Creatinine, Ser 6.04 0.44 - 1.00 mg/dL   Calcium 54.0 (H) 8.9 - 10.3 mg/dL   Total Protein 8.0 6.5 - 8.1 g/dL   Albumin 5.0 3.5 - 5.0 g/dL   AST 21 15 - 41 U/L   ALT 19 0 - 44 U/L   Alkaline Phosphatase 67 38 - 126 U/L   Total Bilirubin 0.9 0.3 - 1.2 mg/dL   GFR, Estimated >98 >11 mL/min    Comment: (NOTE) Calculated using the CKD-EPI Creatinine Equation (2021)    Anion gap 16 (H) 5 - 15    Comment: Performed at [facility], [address] [place], [place] [address]  CBC     Status: Abnormal    Collection Time: 05/12/23  2:51 PM  Result Value Ref Range   WBC 18.3 (H) 4.0 - 10.5 K/uL   RBC 5.21 (H) 3.87 - 5.11 MIL/uL   Hemoglobin 16.2 (H) 12.0 - 15.0 g/dL   HCT 29.5 (H) 62.1 - 30.8 %   MCV 92.3 80.0 - 100.0 fL   MCH 31.1 26.0 - 34.0 pg   MCHC 33.7 30.0 - 36.0 g/dL   RDW 65.7 84.6 - 96.2 %   Platelets 370 150 - 400 K/uL   nRBC 0.0 0.0 - 0.2 %    Comment: Performed at [facility], [address] [place], [place] [address]  Troponin I (High Sensitivity)     Status: None   Collection Time: 05/12/23  2:51 PM  Result Value Ref Range   Troponin I (High Sensitivity) 2 <18 ng/L    Comment: (NOTE) Elevated high sensitivity troponin I (hsTnI) values and significant  changes across serial measurements may suggest ACS but many other  chronic and acute conditions are known to elevate hsTnI results.  Refer to the "Links" section for chest pain algorithms and additional  guidance. Performed at [facility], [address], [place], [place] [address]   Magnesium     Status: None   Collection Time: 05/12/23  2:51  PM  Result Value Ref Range   Magnesium 2.1 1.7 - 2.4 mg/dL    Comment: Performed at [facility], [address] [place], [place] [address]  Culture, blood (Routine X 2) w Reflex to ID Panel     Status: None (Preliminary result)   Collection Time: 05/12/23  8:25 PM   Specimen: BLOOD LEFT ARM  Result Value Ref Range   Specimen Description      BLOOD LEFT ARM Performed at [facility], [address], [place], [place] [address]    Special Requests      BOTTLES DRAWN AEROBIC AND ANAEROBIC Blood Culture results may not be optimal due to an inadequate volume of blood received in culture bottles Performed at [facility], [address], [place], [place] [address]    Culture PENDING    Report Status PENDING   Culture, blood (Routine X 2) w Reflex to ID Panel     Status: None (Preliminary  result)   Collection Time: 05/12/23  8:25 PM   Specimen: BLOOD RIGHT ARM  Result Value Ref Range   Specimen Description      BLOOD RIGHT ARM Performed at [facility], [address], [place], [place] [address]    Special Requests      BOTTLES DRAWN AEROBIC AND ANAEROBIC Blood Culture adequate volume Performed at [facility], [address], [place], [place] [address]    Culture PENDING    Report Status PENDING   Lactic acid, plasma     Status: Abnormal   Collection Time: 05/12/23  8:25 PM  Result Value Ref Range   Lactic Acid, Venous 3.2 (HH) 0.5 - 1.9 mmol/L    Comment: CRITICAL RESULT CALLED TO, READ BACK BY AND VERIFIED WITH: CALLED TO CHRISTIAN ROBINSON 05/12/23 2108 BY Bartolo Darter BOWMAN Performed at [facility], [address] [place], [place] [address]   Protime-INR     Status: None   Collection Time: 05/12/23  8:25 PM  Result Value Ref Range   Prothrombin Time 13.1 11.4 - 15.2 seconds   INR 1.0 0.8 - 1.2    Comment: (NOTE) INR goal varies based on device and disease states. Performed at [facility], [address], [place], [place] [address]   Lactic acid, plasma     Status: None   Collection Time: 05/12/23 11:39 PM  Result Value Ref Range   Lactic Acid, Venous 1.5 0.5 - 1.9 mmol/L    Comment: Performed at [facility], [address], [place], [place] [address]  CK     Status: None   Collection Time: 05/12/23 11:39 PM  Result Value Ref Range   Total CK 97 38 - 234 U/L    Comment: Performed at [facility], [address] [place], [place] [address]  Salicylate level     Status: Abnormal   Collection Time: 05/12/23 11:39 PM  Result Value Ref Range   Salicylate Lvl <7.0 (L) 7.0 - 30.0 mg/dL    Comment: Performed at [facility], [address] [place], [place] [address]   DG Abd Portable 1V-Small Bowel Protocol-Position Verification  Result Date: 05/12/2023 CLINICAL DATA:   Nasogastric tube placement. EXAM: PORTABLE ABDOMEN - 1 VIEW COMPARISON:  None Available. FINDINGS: A nasogastric tube is seen with its distal end looped within the body of the stomach. The distal tip is seen overlying the right upper quadrant, likely within a large gastric hernia. The bowel gas  pattern is normal. No radio-opaque calculi or other significant radiographic abnormality are seen. IMPRESSION: Nasogastric tube positioning, as described above, likely within a large gastric hernia. Electronically Signed   By: Aram Candela M.D.   On: 05/12/2023 20:27   CT Angio Chest Pulmonary Embolism (PE) W or WO Contrast  Result Date: 05/12/2023 CLINICAL DATA:  Nausea and vomiting, chest discomfort, indigestion since yesterday EXAM: CT ANGIOGRAPHY CHEST WITH CONTRAST TECHNIQUE: Multidetector CT imaging of the chest was performed using the standard protocol during bolus administration of intravenous contrast. Multiplanar CT image reconstructions and MIPs were obtained to evaluate the vascular anatomy. RADIATION DOSE REDUCTION: This exam was performed according to the departmental dose-optimization program which includes automated exposure control, adjustment of the mA and/or kV according to patient size and/or use of iterative reconstruction technique. CONTRAST:  85mL OMNIPAQUE IOHEXOL 300 MG/ML  SOLN COMPARISON:  None Available. FINDINGS: Cardiovascular: Satisfactory opacification of the pulmonary arteries to the segmental level. No evidence of pulmonary embolism. Normal heart size. Left and right coronary artery calcifications. No pericardial effusion. Aortic atherosclerosis Mediastinum/Nodes: No enlarged mediastinal, hilar, or axillary lymph nodes. Large hiatal hernia with intrathoracic position of the distal gastric body and antrum. Fluid distended stomach. Thyroid gland, trachea, and esophagus demonstrate no significant findings. Lungs/Pleura: Scarring and/or compressive atelectasis associated hiatal hernia. No  pleural effusion or pneumothorax. Upper Abdomen: Please see separately reported examination of the abdomen and pelvis. Musculoskeletal: No chest wall abnormality. No acute osseous findings. Review of the MIP images confirms the above findings. IMPRESSION: 1. Negative examination for pulmonary embolism. 2. Large hiatal hernia with intrathoracic position of the distal gastric body and antrum. Fluid distended stomach. 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 3. Coronary artery disease. Aortic Atherosclerosis (ICD10-I70.0). Electronically Signed   By: Jearld Lesch M.D.   On: 05/12/2023 19:19   CT ABDOMEN PELVIS W CONTRAST  Result Date: 05/12/2023 CLINICAL DATA:  Abdominal pain, nausea, vomiting since yesterday EXAM: CT ABDOMEN AND PELVIS WITH CONTRAST TECHNIQUE: Multidetector CT imaging of the abdomen and pelvis was performed using the standard protocol following bolus administration of intravenous contrast. RADIATION DOSE REDUCTION: This exam was performed according to the departmental dose-optimization program which includes automated exposure control, adjustment of the mA and/or kV according to patient size and/or use of iterative reconstruction technique. CONTRAST:  85mL OMNIPAQUE IOHEXOL 300 MG/ML  SOLN COMPARISON:  05/19/2014 FINDINGS: Lower chest: Large hiatal hernia with intrathoracic position of the distal gastric body and antrum, with a fluid distended stomach and displacement of the antrum above the gastroesophageal junction. Please additionally see separately reported examination of the chest. Hepatobiliary: No solid liver abnormality is seen. No gallstones, gallbladder wall thickening, or biliary dilatation. Pancreas: Unremarkable. No pancreatic ductal dilatation or surrounding inflammatory changes. Spleen: Normal in size without significant abnormality. Adrenals/Urinary Tract: Adrenal glands are  unremarkable. Kidneys are normal, without renal calculi, solid lesion, or hydronephrosis. Bladder is unremarkable. Stomach/Bowel: Fluid distended stomach, as above with the distal gastric body and antrum displaced into the thoracic cavity above the gastroesophageal junction. Partial right hemicolectomy and reanastomosis. No evidence of bowel wall thickening, distention, or inflammatory changes. Vascular/Lymphatic: Aortic atherosclerosis. No enlarged abdominal or pelvic lymph nodes. Reproductive: Status post hysterectomy. Other: No abdominal wall hernia or abnormality. No ascites. Musculoskeletal: No acute or significant osseous findings. IMPRESSION: 1. Large hiatal hernia with intrathoracic position of the distal gastric body and antrum, with a fluid distended stomach and displacement of  the antrum above the gastroesophageal junction. 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 2. Partial right hemicolectomy and reanastomosis. 3. Status post hysterectomy. Aortic Atherosclerosis (ICD10-I70.0). Electronically Signed   By: Jearld Lesch M.D.   On: 05/12/2023 19:14   DG Chest 2 View  Result Date: 05/12/2023 CLINICAL DATA:  CP EXAM: CHEST - 2 VIEW COMPARISON:  CXR 12/16/22 FINDINGS: No pleural effusion. No pneumothorax. Right heart border is poorly visualized. Normal mediastinal contours. Moderate hiatal hernia. No focal airspace opacity. No radiographically apparent displaced rib fractures degenerative changes in the upper lumbar spine. Vertebral body heights are maintained IMPRESSION: 1. No focal airspace opacity. 2. Moderate hiatal hernia. Electronically Signed   By: Lorenza Cambridge M.D.   On: 05/12/2023 15:48      Assessment/Plan Ms. Illes is a 77 year old female with dementia who presented with a massive hiatal hernia with gastric volvulus.  Clincially, volvulus appears reduced with over a liter decompressed out of NG and patient appearing much more comfortable.     Detailed discussion with husband.  Today I would just like to allow the stomach to rest and recover from the volvulus.  This will also give the family the option to decide how to proceed.  We could eventually try to remove the NG and restart a diet, however I think she would be at high risk of this happening again.  Another option would be to take her to surgery to address the volvulus and possibly fix the hiatal hernia.  I explained laparoscopic reduction of gastric volvulus with possible hiatal hernia repair and possible absorbable mesh with the patient's husband.  We discussed the procedure, its risks, benefits and alternatives.  After a full discussion and all questions answered the husband voiced understanding.  He would like to talk with his family who will be here later today.  He asked if we could return to discuss this further with his family when they arrive.  I explained I may be in the operating room but one of my assistants or myself will do our best to be back to talk more when their family is here to help with a difficult decision.  FEN: NPO, NGT LIWS, IVF per primary ID: none VTE: okay for chemical prophylaxis from surgical standpoint  .   Quentin Ore, MD  Operating Room Services Surgery 05/13/2023, 7:53 AM Please see Amion for pager number during day hours 7:00am-4:30pm

## 2023-05-14 ENCOUNTER — Other Ambulatory Visit: Payer: Self-pay

## 2023-05-14 ENCOUNTER — Encounter (HOSPITAL_COMMUNITY): Payer: Self-pay | Admitting: Family Medicine

## 2023-05-14 ENCOUNTER — Encounter (HOSPITAL_COMMUNITY)
Admission: EM | Disposition: A | Discharge status: Home / Self Care | Payer: Self-pay | Source: Home / Self Care | Attending: Internal Medicine

## 2023-05-14 ENCOUNTER — Inpatient Hospital Stay (HOSPITAL_COMMUNITY): Payer: Medicare PPO | Admitting: Anesthesiology

## 2023-05-14 DIAGNOSIS — K562 Volvulus: Secondary | ICD-10-CM

## 2023-05-14 DIAGNOSIS — K449 Diaphragmatic hernia without obstruction or gangrene: Secondary | ICD-10-CM

## 2023-05-14 DIAGNOSIS — F418 Other specified anxiety disorders: Secondary | ICD-10-CM

## 2023-05-14 DIAGNOSIS — I10 Essential (primary) hypertension: Secondary | ICD-10-CM

## 2023-05-14 HISTORY — PX: HIATAL HERNIA REPAIR: SHX195

## 2023-05-14 HISTORY — PX: LAPAROSCOPIC GASTROSTOMY: SHX5896

## 2023-05-14 HISTORY — PX: INSERTION OF MESH: SHX5868

## 2023-05-14 LAB — ABO/RH: ABO/RH(D): AB POS

## 2023-05-14 LAB — BASIC METABOLIC PANEL
Anion gap: 12 (ref 5–15)
BUN: 14 mg/dL (ref 8–23)
CO2: 25 mmol/L (ref 22–32)
Calcium: 8.3 mg/dL — ABNORMAL LOW (ref 8.9–10.3)
Chloride: 99 mmol/L (ref 98–111)
Creatinine, Ser: 0.92 mg/dL (ref 0.44–1.00)
GFR, Estimated: >60 mL/min (ref >60)
Glucose, Bld: 104 mg/dL — ABNORMAL HIGH (ref 70–99)
Potassium: 3.3 mmol/L — ABNORMAL LOW (ref 3.5–5.1)
Sodium: 136 mmol/L (ref 135–145)

## 2023-05-14 LAB — CBC
HCT: 38.1 % (ref 36.0–46.0)
Hemoglobin: 12.7 g/dL (ref 12.0–15.0)
MCH: 31.8 pg (ref 26.0–34.0)
MCHC: 33.3 g/dL (ref 30.0–36.0)
MCV: 95.3 fL (ref 80.0–100.0)
Platelets: 193 10*3/uL (ref 150–400)
RBC: 4 MIL/uL (ref 3.87–5.11)
RDW: 12.7 % (ref 11.5–15.5)
WBC: 14.5 10*3/uL — ABNORMAL HIGH (ref 4.0–10.5)
nRBC: 0 % (ref 0.0–0.2)

## 2023-05-14 LAB — GLUCOSE, CAPILLARY
Glucose-Capillary: 110 mg/dL — ABNORMAL HIGH (ref 70–99)
Glucose-Capillary: 86 mg/dL (ref 70–99)

## 2023-05-14 LAB — PATHOLOGIST SMEAR REVIEW

## 2023-05-14 LAB — TYPE AND SCREEN
ABO/RH(D): AB POS
Antibody Screen: NEGATIVE

## 2023-05-14 LAB — MAGNESIUM: Magnesium: 1.8 mg/dL (ref 1.7–2.4)

## 2023-05-14 SURGERY — CREATION, GASTROSTOMY, LAPAROSCOPIC
Anesthesia: General | Site: Abdomen

## 2023-05-14 MED ORDER — 0.9 % SODIUM CHLORIDE (POUR BTL) OPTIME
TOPICAL | Status: DC | PRN
  Administered 2023-05-14: 1000 mL

## 2023-05-14 MED ORDER — LIDOCAINE 2% (20 MG/ML) 5 ML SYRINGE
INTRAMUSCULAR | Status: AC
  Filled 2023-05-14: qty 5

## 2023-05-14 MED ORDER — ONDANSETRON HCL 4 MG/2ML IJ SOLN
INTRAMUSCULAR | Status: DC | PRN
  Administered 2023-05-14: 4 mg via INTRAVENOUS

## 2023-05-14 MED ORDER — FENTANYL CITRATE (PF) 250 MCG/5ML IJ SOLN
INTRAMUSCULAR | Status: DC | PRN
  Administered 2023-05-14: 50 ug via INTRAVENOUS
  Administered 2023-05-14: 100 ug via INTRAVENOUS
  Administered 2023-05-14: 50 ug via INTRAVENOUS

## 2023-05-14 MED ORDER — DEXAMETHASONE SODIUM PHOSPHATE 10 MG/ML IJ SOLN
INTRAMUSCULAR | Status: DC | PRN
  Administered 2023-05-14 (×2): 10 mg via INTRAVENOUS

## 2023-05-14 MED ORDER — LACTATED RINGERS IV SOLN: INTRAVENOUS | Status: DC | PRN

## 2023-05-14 MED ORDER — CEFAZOLIN SODIUM-DEXTROSE 2-3 GM-%(50ML) IV SOLR
INTRAVENOUS | Status: DC | PRN
  Administered 2023-05-14: 2 g via INTRAVENOUS

## 2023-05-14 MED ORDER — SUCCINYLCHOLINE CHLORIDE 200 MG/10ML IV SOSY
PREFILLED_SYRINGE | INTRAVENOUS | Status: AC
  Filled 2023-05-14: qty 10

## 2023-05-14 MED ORDER — FENTANYL CITRATE (PF) 100 MCG/2ML IJ SOLN: 25.0000 ug | INTRAMUSCULAR | Status: DC | PRN

## 2023-05-14 MED ORDER — PHENYLEPHRINE 80 MCG/ML (10ML) SYRINGE FOR IV PUSH (FOR BLOOD PRESSURE SUPPORT)
PREFILLED_SYRINGE | INTRAVENOUS | Status: AC
  Filled 2023-05-14: qty 10

## 2023-05-14 MED ORDER — SUGAMMADEX SODIUM 200 MG/2ML IV SOLN
INTRAVENOUS | Status: DC | PRN
  Administered 2023-05-14: 125 mg via INTRAVENOUS

## 2023-05-14 MED ORDER — BUPIVACAINE-EPINEPHRINE (PF) 0.25% -1:200000 IJ SOLN
INTRAMUSCULAR | Status: AC
  Filled 2023-05-14: qty 30

## 2023-05-14 MED ORDER — LIDOCAINE 2% (20 MG/ML) 5 ML SYRINGE
INTRAMUSCULAR | Status: DC | PRN
  Administered 2023-05-14: 60 mg via INTRAVENOUS

## 2023-05-14 MED ORDER — PHENYLEPHRINE HCL-NACL 20-0.9 MG/250ML-% IV SOLN
INTRAVENOUS | Status: DC | PRN
  Administered 2023-05-14: 25 ug/min via INTRAVENOUS

## 2023-05-14 MED ORDER — POTASSIUM CHLORIDE 10 MEQ/100ML IV SOLN
10.0000 meq | INTRAVENOUS | Status: AC
  Administered 2023-05-14 (×4): 10 meq via INTRAVENOUS
  Filled 2023-05-14 (×4): qty 100

## 2023-05-14 MED ORDER — CHLORHEXIDINE GLUCONATE 0.12 % MT SOLN
15.0000 mL | Freq: Once | OROMUCOSAL | Status: AC
  Administered 2023-05-14: 15 mL via OROMUCOSAL
  Filled 2023-05-14 (×2): qty 15

## 2023-05-14 MED ORDER — FENTANYL CITRATE (PF) 250 MCG/5ML IJ SOLN
INTRAMUSCULAR | Status: AC
  Filled 2023-05-14: qty 5

## 2023-05-14 MED ORDER — SUCCINYLCHOLINE CHLORIDE 200 MG/10ML IV SOSY
PREFILLED_SYRINGE | INTRAVENOUS | Status: DC | PRN
  Administered 2023-05-14: 80 mg via INTRAVENOUS

## 2023-05-14 MED ORDER — ONDANSETRON HCL 4 MG/2ML IJ SOLN
INTRAMUSCULAR | Status: AC
  Filled 2023-05-14: qty 2

## 2023-05-14 MED ORDER — DEXAMETHASONE SODIUM PHOSPHATE 10 MG/ML IJ SOLN
INTRAMUSCULAR | Status: AC
  Filled 2023-05-14: qty 1

## 2023-05-14 MED ORDER — PROPOFOL 10 MG/ML IV BOLUS
INTRAVENOUS | Status: DC | PRN
  Administered 2023-05-14: 100 mg via INTRAVENOUS

## 2023-05-14 MED ORDER — ORAL CARE MOUTH RINSE: 15.0000 mL | Freq: Once | OROMUCOSAL | Status: AC

## 2023-05-14 MED ORDER — EPHEDRINE 5 MG/ML INJ
INTRAVENOUS | Status: AC
  Filled 2023-05-14: qty 5

## 2023-05-14 MED ORDER — LACTATED RINGERS IV SOLN: INTRAVENOUS | Status: DC

## 2023-05-14 MED ORDER — ROCURONIUM BROMIDE 10 MG/ML (PF) SYRINGE
PREFILLED_SYRINGE | INTRAVENOUS | Status: AC
  Filled 2023-05-14: qty 10

## 2023-05-14 MED ORDER — DEXTROSE-SODIUM CHLORIDE 5-0.45 % IV SOLN: INTRAVENOUS | Status: DC

## 2023-05-14 MED ORDER — EPHEDRINE SULFATE-NACL 50-0.9 MG/10ML-% IV SOSY
PREFILLED_SYRINGE | INTRAVENOUS | Status: DC | PRN
  Administered 2023-05-14: 5 mg via INTRAVENOUS
  Administered 2023-05-14: 10 mg via INTRAVENOUS

## 2023-05-14 MED ORDER — BUPIVACAINE-EPINEPHRINE 0.25% -1:200000 IJ SOLN
INTRAMUSCULAR | Status: DC | PRN
  Administered 2023-05-14: 30 mL

## 2023-05-14 MED ORDER — SODIUM CHLORIDE 0.9 % IR SOLN
Status: DC | PRN
  Administered 2023-05-14: 1000 mL

## 2023-05-14 MED ORDER — AMISULPRIDE (ANTIEMETIC) 5 MG/2ML IV SOLN: 10.0000 mg | Freq: Once | INTRAVENOUS | Status: DC | PRN

## 2023-05-14 MED ORDER — PROPOFOL 10 MG/ML IV BOLUS
INTRAVENOUS | Status: AC
  Filled 2023-05-14: qty 20

## 2023-05-14 MED ORDER — ROCURONIUM BROMIDE 10 MG/ML (PF) SYRINGE
PREFILLED_SYRINGE | INTRAVENOUS | Status: DC | PRN
  Administered 2023-05-14: 20 mg via INTRAVENOUS
  Administered 2023-05-14: 50 mg via INTRAVENOUS

## 2023-05-14 MED ORDER — HYDROCODONE-ACETAMINOPHEN 5-325 MG PO TABS: 1.0000 | ORAL_TABLET | ORAL | Status: DC | PRN

## 2023-05-14 SURGICAL SUPPLY — 54 items
ADH SKN CLS APL DERMABOND .7 (GAUZE/BANDAGES/DRESSINGS) ×2
ADH SKN CLS LQ APL DERMABOND (GAUZE/BANDAGES/DRESSINGS) ×2
APL PRP STRL LF DISP 70% ISPRP (MISCELLANEOUS) ×2
APL SKNCLS STERI-STRIP NONHPOA (GAUZE/BANDAGES/DRESSINGS) ×2
BAG COUNTER SPONGE SURGICOUNT (BAG) ×2 IMPLANT
BAG SPNG CNTER NS LX DISP (BAG) ×2
BENZOIN TINCTURE PRP APPL 2/3 (GAUZE/BANDAGES/DRESSINGS) ×2 IMPLANT
BINDER ABDOMINAL 12 ML 46-62 (SOFTGOODS) ×2 IMPLANT
CANISTER SUCT 3000ML PPV (MISCELLANEOUS) IMPLANT
CHLORAPREP W/TINT 26 (MISCELLANEOUS) ×2 IMPLANT
COVER SURGICAL LIGHT HANDLE (MISCELLANEOUS) ×2 IMPLANT
DERMABOND ADVANCED .7 DNX12 (GAUZE/BANDAGES/DRESSINGS) ×2 IMPLANT
DERMABOND ADVANCED .7 DNX6 (GAUZE/BANDAGES/DRESSINGS) IMPLANT
ELECT REM PT RETURN 9FT ADLT (ELECTROSURGICAL) ×2
ELECTRODE REM PT RTRN 9FT ADLT (ELECTROSURGICAL) ×2 IMPLANT
GAUZE SPONGE 2X2 8PLY STRL LF (GAUZE/BANDAGES/DRESSINGS) ×2 IMPLANT
GLOVE BIO SURGEON STRL SZ7.5 (GLOVE) ×4 IMPLANT
GLOVE BIOGEL PI IND STRL 7.0 (GLOVE) IMPLANT
GLOVE BIOGEL PI IND STRL 8 (GLOVE) IMPLANT
GLOVE SURG SS PI 6.5 STRL IVOR (GLOVE) IMPLANT
GOWN STRL REUS W/ TWL LRG LVL3 (GOWN DISPOSABLE) ×4 IMPLANT
GOWN STRL REUS W/ TWL XL LVL3 (GOWN DISPOSABLE) ×2 IMPLANT
GOWN STRL REUS W/TWL LRG LVL3 (GOWN DISPOSABLE) ×6
GOWN STRL REUS W/TWL XL LVL3 (GOWN DISPOSABLE) ×2
GRASPER SUT TROCAR 14GX15 (MISCELLANEOUS) IMPLANT
IRRIG SUCT STRYKERFLOW 2 WTIP (MISCELLANEOUS) ×2
IRRIGATION SUCT STRKRFLW 2 WTP (MISCELLANEOUS) IMPLANT
KIT BASIN OR (CUSTOM PROCEDURE TRAY) ×2 IMPLANT
KIT TURNOVER KIT B (KITS) ×2 IMPLANT
MESH BIO-A 7X10 SYN MAT (Mesh General) IMPLANT
NDL HYPO 22X1.5 SAFETY MO (MISCELLANEOUS) IMPLANT
NDL INSUFFLATION 14GA 120MM (NEEDLE) ×2 IMPLANT
NEEDLE HYPO 22X1.5 SAFETY MO (MISCELLANEOUS) ×2 IMPLANT
NEEDLE INSUFFLATION 14GA 120MM (NEEDLE) ×2 IMPLANT
NS IRRIG 1000ML POUR BTL (IV SOLUTION) ×2 IMPLANT
PAD ARMBOARD 7.5X6 YLW CONV (MISCELLANEOUS) ×4 IMPLANT
SCISSORS LAP 5X35 DISP (ENDOMECHANICALS) ×2 IMPLANT
SET TUBE SMOKE EVAC HIGH FLOW (TUBING) ×2 IMPLANT
SHEARS HARMONIC ACE PLUS 36CM (ENDOMECHANICALS) IMPLANT
SHEARS HARMONIC ACE PLUS 45CM (MISCELLANEOUS) IMPLANT
SLEEVE ADV FIXATION 5X100MM (TROCAR) IMPLANT
SUT ETHILON 2 0 FS 18 (SUTURE) ×2 IMPLANT
SUT MNCRL AB 3-0 PS2 18 (SUTURE) ×2 IMPLANT
SUT MNCRL AB 4-0 PS2 18 (SUTURE) IMPLANT
SUT SILK 0 SH 30 (SUTURE) IMPLANT
SUT SILK 2 0 SH (SUTURE) ×4 IMPLANT
TOWEL GREEN STERILE (TOWEL DISPOSABLE) ×2 IMPLANT
TOWEL GREEN STERILE FF (TOWEL DISPOSABLE) ×2 IMPLANT
TRAY LAPAROSCOPIC MC (CUSTOM PROCEDURE TRAY) ×2 IMPLANT
TROCAR 11X100 Z THREAD (TROCAR) IMPLANT
TROCAR ADV FIXATION 5X100MM (TROCAR) IMPLANT
TUBE MOSS GAS 18FR (TUBING) IMPLANT
WARMER LAPAROSCOPE (MISCELLANEOUS) ×2 IMPLANT
WATER STERILE IRR 1000ML POUR (IV SOLUTION) ×2 IMPLANT

## 2023-05-14 NOTE — Plan of Care (Signed)

## 2023-05-14 NOTE — Transfer of Care (Signed)
Immediate Anesthesia Transfer of Care Note  Patient: Candace Manning  Procedure(s) Performed: LAPAROSCOPIC GASTROPEXY (Abdomen) LAPAROSCOPIC HIATAL HERNIA REPAIR WITH MESH (Abdomen) INSERTION OF MESH (Abdomen)  Patient Location: PACU  Anesthesia Type:General  Level of Consciousness: drowsy  Airway & Oxygen Therapy: Patient Spontanous Breathing and Patient connected to face mask oxygen  Post-op Assessment: Report given to RN, Post -op Vital signs reviewed and stable, and Patient moving all extremities X 4  Post vital signs: Reviewed and stable  Last Vitals:  Vitals Value Taken Time  BP 149/63 05/14/23 1411  Temp 36.1 C 05/14/23 1411  Pulse 76 05/14/23 1411  Resp 18 05/14/23 1411  SpO2 96 % 05/14/23 1411  Vitals shown include unvalidated device data.  Last Pain:  Vitals:   05/14/23 0800  TempSrc:   PainSc: 0-No pain         Complications: No notable events documented.

## 2023-05-14 NOTE — Progress Notes (Signed)
Patient's husband, Mr. Asherah, Shilts was called to confirm that he signed the patient's consent for surgery. Husband verbalized that he spoke with Dr. Dossie Der and he signed the consent. Also, the husband is OK with blood transfusion if there will be an emergency.

## 2023-05-14 NOTE — Anesthesia Postprocedure Evaluation (Signed)
Anesthesia Post Note  Patient: Candace Manning  Procedure(s) Performed: LAPAROSCOPIC GASTROPEXY (Abdomen) LAPAROSCOPIC HIATAL HERNIA REPAIR WITH MESH (Abdomen) INSERTION OF MESH (Abdomen)     Patient location during evaluation: PACU Anesthesia Type: General Level of consciousness: awake and alert and confused Pain management: pain level controlled Vital Signs Assessment: post-procedure vital signs reviewed and stable Respiratory status: spontaneous breathing, nonlabored ventilation, respiratory function stable and patient connected to nasal cannula oxygen Cardiovascular status: blood pressure returned to baseline and stable Postop Assessment: no apparent nausea or vomiting Anesthetic complications: no   No notable events documented.  Last Vitals:  Vitals:   05/14/23 1445 05/14/23 1553  BP: (!) 156/76 (!) 170/106  Pulse: 76 80  Resp: 16 18  Temp: (!) 36.4 C 36.5 C  SpO2: 92% 99%    Last Pain:  Vitals:   05/14/23 1553  TempSrc: Oral  PainSc:                  [name],[name]

## 2023-05-14 NOTE — Progress Notes (Signed)
Pt's both rings are given to her husband.

## 2023-05-14 NOTE — Progress Notes (Signed)
Explained to patient's husband that home medications were being held due to NPO status nothing to eat or drink and the diagnoses of Volvulus according to to chat note from V. Rathorie NP [facility]. Husband voiced understanding and stated he will address this in am with MD.

## 2023-05-14 NOTE — Anesthesia Preprocedure Evaluation (Addendum)
Anesthesia Evaluation  Patient identified by MRN, date of birth, ID band Patient confused  General Assessment Comment:Very pleasant but with dementia  Reviewed: Allergy & Precautions, NPO status , Patient's Chart, lab work & pertinent test results  History of Anesthesia Complications Negative for: history of anesthetic complications  Airway Mallampati: II  TM Distance: >3 FB Neck ROM: Full   Comment: Previous grade I view with MAC 3, easy mask Dental  (+) Dental Advisory Given   Pulmonary neg pulmonary ROS   Pulmonary exam normal breath sounds clear to auscultation       Cardiovascular hypertension, (-) angina (-) Cardiac Stents and (-) CABG + dysrhythmias (LBBB) + Valvular Problems/Murmurs MVP  Rhythm:Regular Rate:Normal  HLD   Neuro/Psych  Headaches, neg Seizures PSYCHIATRIC DISORDERS Anxiety Depression   Dementia    GI/Hepatic Neg liver ROS, hiatal hernia,GERD  Medicated,,  Endo/Other  negative endocrine ROS    Renal/GU negative Renal ROS     Musculoskeletal osteopenia   Abdominal   Peds  Hematology negative hematology ROS (+)   Anesthesia Other Findings 77 year old female with dementia who presented with a massive hiatal hernia with gastric volvulus.  Reproductive/Obstetrics H/o ovarian cancer 2015                             Anesthesia Physical Anesthesia Plan  ASA: 3  Anesthesia Plan: General   Post-op Pain Management:    Induction: Intravenous and Rapid sequence  PONV Risk Score and Plan: 3 and Ondansetron, Dexamethasone and Treatment may vary due to age or medical condition  Airway Management Planned: Oral ETT  Additional Equipment:   Intra-op Plan:   Post-operative Plan: Extubation in OR  Informed Consent: I have reviewed the patients History and Physical, chart, labs and discussed the procedure including the risks, benefits and alternatives for the proposed anesthesia  with the patient or authorized representative who has indicated his/her understanding and acceptance.     Dental advisory given  Plan Discussed with: CRNA and Anesthesiologist  Anesthesia Plan Comments: (Risks of general anesthesia discussed including, but not limited to, sore throat, hoarse voice, chipped/damaged teeth, injury to vocal cords, nausea and vomiting, allergic reactions, lung infection, heart attack, stroke, and death. All questions answered. )        Anesthesia Quick Evaluation

## 2023-05-14 NOTE — Anesthesia Procedure Notes (Signed)
Procedure Name: Intubation Date/Time: 05/14/2023 11:00 AM  Performed by: Randon Goldsmith, CRNAPre-anesthesia Checklist: Patient identified, Emergency Drugs available, Suction available and Patient being monitored Patient Re-evaluated:Patient Re-evaluated prior to induction Oxygen Delivery Method: Circle system utilized Preoxygenation: Pre-oxygenation with 100% oxygen Induction Type: IV induction, Rapid sequence and Cricoid Pressure applied Laryngoscope Size: Mac and 3 Grade View: Grade I Tube type: Oral Tube size: 7.0 mm Number of attempts: 1 Airway Equipment and Method: Stylet and Oral airway Placement Confirmation: ETT inserted through vocal cords under direct vision, positive ETCO2 and breath sounds checked- equal and bilateral Secured at: 22 cm Tube secured with: Tape Dental Injury: Teeth and Oropharynx as per pre-operative assessment

## 2023-05-14 NOTE — Progress Notes (Signed)
Printed information on Volvulus and it was given to the husband.

## 2023-05-14 NOTE — Progress Notes (Signed)
PROGRESS NOTE    Candace Manning  WGN:[id number] DOB: 15-Sep-1946 DOA: 05/12/2023 PCP: Gweneth Dimitri, MD   Brief Narrative:  77 y.o. female with medical history significant for severe Alzheimer dementia, essential pretension, and ovarian cancer status post right hemicolectomy with reanastomosis, GAD, GERD, hiatal hernia, hyperlipidemia, who is admitted to [facility] on 05/12/2023 by way of transfer from [facility] with concern for gastric volvulus with associated obstruction after presenting from home to the latter facility for evaluation of nausea/vomiting.  CT abdomen pelvis was consistent with large hiatal hernia with concerns of gastric volvulus/obstruction.  NG tube was placed, general surgery was consulted.   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  Continue n.p.o. status.  Accu-Cheks every 6 hours while NPO Family working with general surgery to explore surgical options  Hypokalemia - As needed repletion  Large hiatal hernia - PPI  Moderate dehydration/SIRS Hypercalcemia Lactic acidosis; resolved. - Aggressive IV fluids  Essential hypertension - Home meds on hold.  IV as needed  GERD - PPI  Hyperlipidemia -Home meds on hold  History of Alzheimer's - Supportive care  Resume home medication when able  DVT prophylaxis: SCDs Code Status: Full code Family Communication: Spouse updated at phone Status is: Inpatient Plans for surgery today      Diet Orders (From admission, onward)     Start     Ordered   05/12/23 2012  Diet NPO time specified  Diet effective now         05/12/23 2011            Subjective: Patient seen in preop area.  No complaints.  Pleasantly confused  Examination: Constitutional: Not in acute distress, nasal gastric tube in place Respiratory: Clear to auscultation bilaterally Cardiovascular: Normal sinus rhythm, no rubs Abdomen: Nontender nondistended good bowel sounds Musculoskeletal: No edema noted Skin: No rashes seen Neurologic: CN 2-12 grossly intact.  And nonfocal Psychiatric: Poor judgment and InSight.  Alert to name   Objective: Vitals:   05/13/23 1232 05/13/23 1635 05/13/23 1951 05/14/23 0500  BP: (!) 151/87 (!) 141/73 139/69 (!) 105/57  Pulse: 77 74  66  Resp: 18 20 18 16   Temp: 98.6 F (37 C) (!) 97.3 F (36.3 C) 99 F (37.2 C) 98 F (36.7 C)  TempSrc: Oral Oral Oral Axillary  SpO2: (!) 89% (!) 89% 91% 99%  Weight:      Height:        Intake/Output Summary (Last 24 hours) at 05/14/2023 0750 Last data filed at 05/14/2023 0338 Gross per 24 hour  Intake --  Output 501 ml  Net -501 ml   Filed Weights   05/12/23 1449  Weight: 59 kg    Scheduled Meds:  pantoprazole (PROTONIX) IV  40 mg Intravenous Q12H   Continuous Infusions:  dextrose 5 % and 0.45 % NaCl 75 mL/hr at 05/13/23 2114    Nutritional status     Body mass index is 21.63 kg/m.  Data Reviewed:   CBC: Recent Labs  Lab 05/12/23 1451 05/13/23 0737  WBC 18.3* 14.5*  NEUTROABS  --  7.2  HGB 16.2* 13.1  HCT 48.1* 40.6  MCV 92.3 94.6  PLT 370 289   Basic Metabolic Panel: Recent Labs  Lab 05/12/23 1451 05/13/23 0737  NA 141 142  K 3.6 3.0*  CL 97* 95*  CO2 28 31  GLUCOSE 144* 108*  BUN 16 20  CREATININE 0.84 0.97  CALCIUM 10.6* 9.6  MG 2.1 2.0  PHOS  --  5.0*   GFR: Estimated Creatinine Clearance: 45.1 mL/min (by C-G formula based on SCr of 0.97 mg/dL). Liver Function Tests: Recent Labs  Lab 05/12/23 1451  AST 21  ALT 19  ALKPHOS 67  BILITOT 0.9  PROT 8.0  ALBUMIN 5.0   Recent Labs  Lab 05/12/23 1451   LIPASE 19   No results for input(s): "AMMONIA" in the last 168 hours. Coagulation Profile: Recent Labs  Lab 05/12/23 2025  INR 1.0   Cardiac Enzymes: Recent Labs  Lab 05/12/23 2339  CKTOTAL 97   BNP (last 3 results) No results for input(s): "PROBNP" in the last 8760 hours. HbA1C: No results for input(s): "HGBA1C" in the last 72 hours. CBG: Recent Labs  Lab 05/13/23 1236 05/14/23 0503  GLUCAP 110* 110*   Lipid Profile: No results for input(s): "CHOL", "HDL", "LDLCALC", "TRIG", "CHOLHDL", "LDLDIRECT" in the last 72 hours. Thyroid Function Tests: No results for input(s): "TSH", "T4TOTAL", "FREET4", "T3FREE", "THYROIDAB" in the last 72 hours. Anemia Panel: No results for input(s): "VITAMINB12", "FOLATE", "FERRITIN", "TIBC", "IRON", "RETICCTPCT" in the last 72 hours. Sepsis Labs: Recent Labs  Lab 05/12/23 2025 05/12/23 2339  LATICACIDVEN 3.2* 1.5    Recent Results (from the past 240 hour(s))  Culture, blood (Routine X 2) w Reflex to ID Panel     Status: None (Preliminary result)   Collection Time: 05/12/23  8:25 PM   Specimen: BLOOD LEFT ARM  Result Value Ref Range Status   Specimen Description   Final    BLOOD LEFT ARM Performed at [facility], [address] [place], [place] [address]    Special Requests   Final    BOTTLES DRAWN AEROBIC AND ANAEROBIC Blood Culture results may not be optimal due to an inadequate volume of blood received in culture bottles Performed at [facility], [address] [place], [place] [address]    Culture   Final    NO GROWTH < 12 HOURS Performed at [facility], [address] [place], [place] [address]    Report Status PENDING  Incomplete  Culture, blood (Routine X 2) w Reflex to ID Panel     Status: None (Preliminary result)   Collection Time: 05/12/23  8:25 PM   Specimen: BLOOD RIGHT ARM  Result Value Ref Range Status   Specimen Description   Final    BLOOD RIGHT ARM Performed at [facility], [address], [place], [place] [address]    Special Requests   Final    BOTTLES DRAWN AEROBIC AND ANAEROBIC Blood Culture adequate volume Performed at [facility], [address] [place], [place] [address]    Culture   Final    NO GROWTH < 12 HOURS Performed at [facility], [address], [place], [place] [address]    Report Status PENDING  Incomplete         Radiology Studies: DG Abd Portable 1V-Small Bowel Protocol-Position Verification  Result Date: 05/12/2023 CLINICAL DATA:  Nasogastric tube placement. EXAM: PORTABLE ABDOMEN - 1 VIEW COMPARISON:  None Available. FINDINGS: A nasogastric tube is seen with  its distal end looped within the body of the stomach. The distal tip is seen overlying the right upper quadrant, likely within a large gastric hernia. The bowel gas pattern is normal. No radio-opaque calculi or other significant radiographic abnormality are seen. IMPRESSION: Nasogastric tube positioning, as described above, likely within a large gastric hernia. Electronically Signed   By: Aram Candela M.D.   On: 05/12/2023 20:27   CT Angio Chest Pulmonary Embolism (PE) W or WO Contrast  Result Date: 05/12/2023 CLINICAL DATA:  Nausea and vomiting, chest discomfort, indigestion since yesterday EXAM: CT ANGIOGRAPHY CHEST WITH CONTRAST TECHNIQUE: Multidetector CT imaging of the chest was performed using the standard protocol during bolus administration of intravenous contrast. Multiplanar CT image reconstructions and MIPs were obtained to evaluate the vascular anatomy. RADIATION DOSE REDUCTION: This exam was performed according to the departmental dose-optimization program which includes automated exposure control, adjustment of the mA and/or kV according to patient size and/or use of iterative reconstruction technique. CONTRAST:  85mL OMNIPAQUE IOHEXOL 300 MG/ML  SOLN COMPARISON:  None Available. FINDINGS: Cardiovascular: Satisfactory  opacification of the pulmonary arteries to the segmental level. No evidence of pulmonary embolism. Normal heart size. Left and right coronary artery calcifications. No pericardial effusion. Aortic atherosclerosis Mediastinum/Nodes: No enlarged mediastinal, hilar, or axillary lymph nodes. Large hiatal hernia with intrathoracic position of the distal gastric body and antrum. Fluid distended stomach. Thyroid gland, trachea, and esophagus demonstrate no significant findings. Lungs/Pleura: Scarring and/or compressive atelectasis associated hiatal hernia. No pleural effusion or pneumothorax. Upper Abdomen: Please see separately reported examination of the abdomen and pelvis. Musculoskeletal: No chest wall abnormality. No acute osseous findings. Review of the MIP images confirms the above findings. IMPRESSION: 1. Negative examination for pulmonary embolism. 2. Large hiatal hernia with intrathoracic position of the distal gastric body and antrum. Fluid distended stomach. 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 3. Coronary artery disease. Aortic Atherosclerosis (ICD10-I70.0). Electronically Signed   By: Jearld Lesch M.D.   On: 05/12/2023 19:19   CT ABDOMEN PELVIS W CONTRAST  Result Date: 05/12/2023 CLINICAL DATA:  Abdominal pain, nausea, vomiting since yesterday EXAM: CT ABDOMEN AND PELVIS WITH CONTRAST TECHNIQUE: Multidetector CT imaging of the abdomen and pelvis was performed using the standard protocol following bolus administration of intravenous contrast. RADIATION DOSE REDUCTION: This exam was performed according to the departmental dose-optimization program which includes automated exposure control, adjustment of the mA and/or kV according to patient size and/or use of iterative reconstruction technique. CONTRAST:  85mL OMNIPAQUE IOHEXOL 300 MG/ML  SOLN COMPARISON:  05/19/2014 FINDINGS: Lower chest: Large hiatal  hernia with intrathoracic position of the distal gastric body and antrum, with a fluid distended stomach and displacement of the antrum above the gastroesophageal junction. Please additionally see separately reported examination of the chest. Hepatobiliary: No solid liver abnormality is seen. No gallstones, gallbladder wall thickening, or biliary dilatation. Pancreas: Unremarkable. No pancreatic ductal dilatation or surrounding inflammatory changes. Spleen: Normal in size without significant abnormality. Adrenals/Urinary Tract: Adrenal glands are unremarkable. Kidneys are normal, without renal calculi, solid lesion, or hydronephrosis. Bladder is unremarkable. Stomach/Bowel: Fluid distended stomach, as above with the distal gastric body and antrum displaced into the thoracic cavity above the gastroesophageal junction. Partial right hemicolectomy and reanastomosis. No evidence of bowel wall thickening, distention, or inflammatory changes. Vascular/Lymphatic: Aortic atherosclerosis. No enlarged abdominal or pelvic lymph nodes. Reproductive: Status post hysterectomy. Other: No abdominal wall hernia or abnormality. No ascites. Musculoskeletal:  No acute or significant osseous findings. IMPRESSION: 1. Large hiatal hernia with intrathoracic position of the distal gastric body and antrum, with a fluid distended stomach and displacement of the antrum above the gastroesophageal junction. 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 2. Partial right hemicolectomy and reanastomosis. 3. Status post hysterectomy. Aortic Atherosclerosis (ICD10-I70.0). Electronically Signed   By: Jearld Lesch M.D.   On: 05/12/2023 19:14   DG Chest 2 View  Result Date: 05/12/2023 CLINICAL DATA:  CP EXAM: CHEST - 2 VIEW COMPARISON:  CXR 12/16/22 FINDINGS: No pleural effusion. No pneumothorax. Right heart border is poorly visualized. Normal mediastinal contours. Moderate hiatal hernia. No focal airspace  opacity. No radiographically apparent displaced rib fractures degenerative changes in the upper lumbar spine. Vertebral body heights are maintained IMPRESSION: 1. No focal airspace opacity. 2. Moderate hiatal hernia. Electronically Signed   By: Lorenza Cambridge M.D.   On: 05/12/2023 15:48           LOS: 2 days   Time spent= 35 mins    Archer Moist Joline Maxcy, MD [facility]  If 7PM-7AM, please contact night-coverage  05/14/2023, 7:50 AM

## 2023-05-14 NOTE — Progress Notes (Signed)
Progress Note: General Surgery Service   Chief Complaint/Subjective: Comfortable  Objective: Vital signs in last 24 hours: Temp:  [97.3 F (36.3 C)-99 F (37.2 C)] 98 F (36.7 C) (06/06 0500) Pulse Rate:  [66-77] 66 (06/06 0500) Resp:  [16-20] 16 (06/06 0500) BP: (105-151)/(57-87) 105/57 (06/06 0500) SpO2:  [89 %-99 %] 99 % (06/06 0500)    Intake/Output from previous day: 06/05 0701 - 06/06 0700 In: -  Out: 501 [Emesis/NG output:500; Stool:1] Intake/Output this shift: No intake/output data recorded.  GI: Abd soft, nontender Lab Results: CBC  Recent Labs    05/13/23 0737 05/14/23 0755  WBC 14.5* 14.5*  HGB 13.1 12.7  HCT 40.6 38.1  PLT 289 193   BMET Recent Labs    05/12/23 1451 05/13/23 0737  NA 141 142  K 3.6 3.0*  CL 97* 95*  CO2 28 31  GLUCOSE 144* 108*  BUN 16 20  CREATININE 0.84 0.97  CALCIUM 10.6* 9.6   PT/INR Recent Labs    05/12/23 2025  LABPROT 13.1  INR 1.0   ABG No results for input(s): "PHART", "HCO3" in the last 72 hours.  Invalid input(s): "PCO2", "PO2"  Anti-infectives: Anti-infectives (From admission, onward)    Start     Dose/Rate Route Frequency Ordered Stop   05/14/23 0600  ceFAZolin (ANCEF) IVPB 2g/100 mL premix        2 g 200 mL/hr over 30 Minutes Intravenous  Once 05/13/23 1602 05/14/23 0541       Medications: Scheduled Meds:  pantoprazole (PROTONIX) IV  40 mg Intravenous Q12H   Continuous Infusions:  dextrose 5 % and 0.45 % NaCl     PRN Meds:.acetaminophen **OR** acetaminophen, alum & mag hydroxide-simeth, guaiFENesin, hydrALAZINE, hydrocortisone, hydrocortisone cream, ipratropium-albuterol, lip balm, loratadine, LORazepam, metoprolol tartrate, morphine injection, Muscle Rub, OLANZapine, ondansetron (ZOFRAN) IV, phenol, polyvinyl alcohol, senna-docusate, sodium chloride, traZODone  Assessment/Plan: s/p Procedure(s): LAPAROSCOPIC VERSUS OPEN GASTROPEXY, POSSIBLE GASTROSTOMY POSSIBLE HERNIA REPAIR  HIATAL ESOPHAGOGASTRODUODENOSCOPY (EGD) 05/14/2023  Candace Manning is a 77 year old female with dementia who presented with a massive hiatal hernia with gastric volvulus.  Clincially, volvulus appears reduced with over a liter decompressed out of NG and patient appearing much more comfortable.     Detailed discussion with husband.  My PA talked with family yesterday later in the day as well.  Family would like to proceed with surgery.  I recommended laparoscopic reduction of gastric volvulus with gastropexy, possible hiatal hernia repair, possible gastrostomy tube placement.  I explained the procedure, its risks, benefits and alternatives.  After a full discussion and all questions answered the patient's husband granted consent to proceed.    LOS: 2 days   Quentin Ore, MD  [facility] Use [email or web address] to contact on call provider

## 2023-05-14 NOTE — Op Note (Signed)
Patient: Candace Manning ([birth year], [id number])  Date of Surgery: 05/14/23  Preoperative Diagnosis: Hiatal hernia with gastric volvulus   Postoperative Diagnosis: Hiatal hernia with gastric volvulus   Surgical Procedure:  Laparoscopic reduction of gastric volvulus Laparoscopic hiatal hernia repair with Bio-A mesh Laparoscopic sutured gastropexy  Operative Team Members:  Surgeon(s) and Role:    * Adaria Hole, Hyman Hopes, MD - Primary   Anesthesiologist: Linton Rump, MD; Jairo Ben, MD CRNA: Laruth Bouchard., CRNA; Randon Goldsmith, CRNA; Adria Dill, CRNA   Anesthesia: General   Fluids:  Total I/O In: 1600 [I.V.:1600] Out: 50 [Blood:50]  Complications: None  Drains:  none   Specimen: None  Disposition:  PACU - hemodynamically stable.  Plan of Care:  Continue inpatient care  Indications for Procedure: Candace Manning is a 77 y.o. female who presented with abdominal/chest pain, nausea and vomiting.  She was found to have a massive hiatal hernia with gastric volvulus.  NG tube was placed and she felt better with decompression.  I recommended laparoscopic reduction of gastric volvulus with gastropexy, possible hiatal hernia repair with mesh, possible gastrostomy tube.  We discussed the procedure, its risks, benefits and alternatives.  After a full discussion and all questions answered, the patient's family granted consent to proceed.  Findings: Gastric volvulus and massive hiatal hernia.  Description of Procedure:   The patient was positioned supine, padded and secured to the bed.  The abdomen was widely prepped and draped.  A time out procedure was performed.   A veress needle was used at Palmer's point to insuflate the abdomen to .  Three 5mm and one 12mm trocars were placed across the mid abdomen: 5mm in the right lateral abdomen, 12mm in the right mid medial abdomen, and two 5mm in the left mid abdomen.  A Nathanson liver retractor was placed  at the epigastrium and utilized to retract the left lobe of the liver anteriorly.  It was held in position with a laparoscopic holding device clamped to the side of the bed.  Dissection began using the harmonic scalpel.  Beginning at the twelve o'clock position on the crus, the hernia sac was grasped and reduced form the mediastinum.  The hernia sac was divided to enter the plane between the sac and the mediastinum.  The hernia sac was divided towards the left and right with continued traction on the hernia sac to reduce it.  A mediastinal dissection was performed to further reduce the hernia sac which facilitated reduction of the stomach as well.  The hernia sac was disconnected from the right and left crura and subsequently, the hernia sac was dissected from the stomach and esophagus, and excised.  The short gastric vessels were divided from the inferior pole of the spleen, superiorly, to completely disconnect the stomach from the spleen and the left hemidiaphragm.  All posterior gastric attachments to the lesser sac and retroperitoneum were divided.  The left crus was further delineated.  A retroesophageal window was created and an instrument was placed behind the esophagus for retraction.    A high, circumferential mediastinal dissection was performed in an effort to mobilize the esophagus and provide for adequate intraabdominal esophageal length.  The mediastinal dissection was performed bluntly, with little to no thermal energy.  The anterior and posterior vagus nerves were both identified and preserved.  The crural defect was reapproximated with multiple, interrupted, 0 Silk sutures.  The crural pillars came together well with minimal tearing of the adjacent diaphragmatic tissue.  A piece of Bio-A mesh was placed over the crural repair and fixed in place using 3 sutures.  The stomach was pexied to the abdominal wall near the costal margin on the left using three 0-silk sutures.  The operative  field was inspected for hemostasis.  The 12 mm trocar site was closed with a figure of eight 0 Vicryl suture utilizing the suture passer.    The peritoneal cavity was desufflated and the trocars removed.  The incisions were closed with 4-0 Monocryl subcuticular sutures and skin glue.    Ivar Drape, MD General, Bariatric, & Minimally Invasive Surgery [facility], [place]

## 2023-05-15 ENCOUNTER — Encounter (HOSPITAL_COMMUNITY): Payer: Self-pay | Admitting: Surgery

## 2023-05-15 DIAGNOSIS — K562 Volvulus: Secondary | ICD-10-CM | POA: Diagnosis not present

## 2023-05-15 LAB — CBC
HCT: 40.2 % (ref 36.0–46.0)
Hemoglobin: 13.3 g/dL (ref 12.0–15.0)
MCH: 31.6 pg (ref 26.0–34.0)
MCHC: 33.1 g/dL (ref 30.0–36.0)
MCV: 95.5 fL (ref 80.0–100.0)
Platelets: 283 10*3/uL (ref 150–400)
RBC: 4.21 MIL/uL (ref 3.87–5.11)
RDW: 12.2 % (ref 11.5–15.5)
WBC: 17.2 10*3/uL — ABNORMAL HIGH (ref 4.0–10.5)
nRBC: 0 % (ref 0.0–0.2)

## 2023-05-15 LAB — CULTURE, BLOOD (ROUTINE X 2): Culture: NO GROWTH

## 2023-05-15 LAB — BASIC METABOLIC PANEL
Anion gap: 9 (ref 5–15)
BUN: 7 mg/dL — ABNORMAL LOW (ref 8–23)
CO2: 25 mmol/L (ref 22–32)
Calcium: 8.2 mg/dL — ABNORMAL LOW (ref 8.9–10.3)
Chloride: 98 mmol/L (ref 98–111)
Creatinine, Ser: 0.78 mg/dL (ref 0.44–1.00)
GFR, Estimated: >60 mL/min (ref >60)
Glucose, Bld: 142 mg/dL — ABNORMAL HIGH (ref 70–99)
Potassium: 3 mmol/L — ABNORMAL LOW (ref 3.5–5.1)
Sodium: 132 mmol/L — ABNORMAL LOW (ref 135–145)

## 2023-05-15 LAB — MAGNESIUM: Magnesium: 1.6 mg/dL — ABNORMAL LOW (ref 1.7–2.4)

## 2023-05-15 LAB — GLUCOSE, CAPILLARY
Glucose-Capillary: 136 mg/dL — ABNORMAL HIGH (ref 70–99)
Glucose-Capillary: 135 mg/dL — ABNORMAL HIGH (ref 70–99)

## 2023-05-15 MED ORDER — DOCUSATE SODIUM 100 MG PO CAPS: 100.0000 mg | ORAL_CAPSULE | Freq: Every day | ORAL | Status: AC | PRN

## 2023-05-15 MED ORDER — PANTOPRAZOLE SODIUM 40 MG PO TBEC: 40.0000 mg | DELAYED_RELEASE_TABLET | Freq: Two times a day (BID) | ORAL | Status: DC

## 2023-05-15 MED ORDER — QUETIAPINE FUMARATE 25 MG PO TABS: 50.0000 mg | ORAL_TABLET | Freq: Every day | ORAL | Status: DC

## 2023-05-15 MED ORDER — ESCITALOPRAM OXALATE 10 MG PO TABS
20.0000 mg | ORAL_TABLET | Freq: Every day | ORAL | Status: DC
  Administered 2023-05-15: 20 mg via ORAL
  Filled 2023-05-15: qty 2

## 2023-05-15 MED ORDER — MEMANTINE HCL 10 MG PO TABS
10.0000 mg | ORAL_TABLET | Freq: Two times a day (BID) | ORAL | Status: DC
  Administered 2023-05-15: 10 mg via ORAL
  Filled 2023-05-15: qty 1

## 2023-05-15 MED ORDER — ENSURE ENLIVE PO LIQD: 237.0000 mL | Freq: Two times a day (BID) | ORAL | Status: DC

## 2023-05-15 MED ORDER — QUETIAPINE FUMARATE 25 MG PO TABS: 75.0000 mg | ORAL_TABLET | Freq: Every day | ORAL | Status: DC

## 2023-05-15 MED ORDER — HYDROCODONE-ACETAMINOPHEN 5-325 MG PO TABS: 1.0000 | ORAL_TABLET | Freq: Four times a day (QID) | ORAL | 0 refills | Status: DC | PRN

## 2023-05-15 MED ORDER — DONEPEZIL HCL 10 MG PO TABS: 10.0000 mg | ORAL_TABLET | Freq: Every day | ORAL | Status: DC

## 2023-05-15 MED ORDER — PANTOPRAZOLE SODIUM 40 MG PO TBEC: 40.0000 mg | DELAYED_RELEASE_TABLET | Freq: Two times a day (BID) | ORAL | 0 refills | Status: DC

## 2023-05-15 NOTE — Progress Notes (Signed)
Progress Note: General Surgery Service   Chief Complaint/Subjective: Doing well post op.  Tolerating liquids without issue.  Objective: Vital signs in last 24 hours: Temp:  [97 F (36.1 C)-99.7 F (37.6 C)] 98.7 F (37.1 C) (06/07 0520) Pulse Rate:  [72-88] 80 (06/07 0520) Resp:  [13-21] 18 (06/07 0520) BP: (133-170)/(63-106) 140/75 (06/07 0520) SpO2:  [92 %-99 %] 94 % (06/07 0520) Weight:  [58.9 kg] 58.9 kg (06/06 0944)    Intake/Output from previous day: 06/06 0701 - 06/07 0700 In: 1600 [I.V.:1600] Out: 50 [Blood:50] Intake/Output this shift: No intake/output data recorded.  Constitutional: NAD; conversant; no deformities Eyes: Moist conjunctiva; no lid lag; anicteric; PERRL Neck: Trachea midline; no thyromegaly Lungs: Normal respiratory effort; no tactile fremitus CV: RRR; no palpable thrills; no pitting edema GI: Abd Incisions c/d/I w/ glue; no palpable hepatosplenomegaly MSK: Normal range of motion of extremities; no clubbing/cyanosis Psychiatric: Appropriate affect; alert and oriented x3 Lymphatic: No palpable cervical or axillary lymphadenopathy  Lab Results: CBC  Recent Labs    05/13/23 0737 05/14/23 0755  WBC 14.5* 14.5*  HGB 13.1 12.7  HCT 40.6 38.1  PLT 289 193   BMET Recent Labs    05/13/23 0737 05/14/23 0755  NA 142 136  K 3.0* 3.3*  CL 95* 99  CO2 31 25  GLUCOSE 108* 104*  BUN 20 14  CREATININE 0.97 0.92  CALCIUM 9.6 8.3*   PT/INR Recent Labs    05/12/23 2025  LABPROT 13.1  INR 1.0   ABG No results for input(s): "PHART", "HCO3" in the last 72 hours.  Invalid input(s): "PCO2", "PO2"  Anti-infectives: Anti-infectives (From admission, onward)    Start     Dose/Rate Route Frequency Ordered Stop   05/14/23 0600  ceFAZolin (ANCEF) IVPB 2g/100 mL premix        2 g 200 mL/hr over 30 Minutes Intravenous  Once 05/13/23 1602 05/14/23 0541       Medications: Scheduled Meds:  donepezil  10 mg Oral QHS   escitalopram  20 mg Oral  Daily   memantine  10 mg Oral BID   pantoprazole (PROTONIX) IV  40 mg Intravenous Q12H   QUEtiapine  75 mg Oral QHS   Continuous Infusions:  dextrose 5 % and 0.45 % NaCl 75 mL/hr at 05/15/23 0517   PRN Meds:.acetaminophen **OR** acetaminophen, alum & mag hydroxide-simeth, guaiFENesin, hydrALAZINE, HYDROcodone-acetaminophen, hydrocortisone, hydrocortisone cream, ipratropium-albuterol, lip balm, loratadine, LORazepam, metoprolol tartrate, morphine injection, Muscle Rub, OLANZapine, ondansetron (ZOFRAN) IV, phenol, polyvinyl alcohol, senna-docusate, sodium chloride, traZODone  Assessment/Plan: s/p Procedure(s): LAPAROSCOPIC GASTROPEXY LAPAROSCOPIC HIATAL HERNIA REPAIR WITH MESH INSERTION OF MESH 05/14/2023  Doing well POD1 Okay for discharge - discussed with husband (caregiver) who feels comfortable, has help of family and caregiver at home Soft diet for two weeks at home, may have some dysphagia, then advance diet as tolerated     LOS: 3 days   FEN: Soft diet ID: None VTE: SCDs, lovenox Foley: none Dispo: home, follow up with me in 4 weeks    Quentin Ore, MD  [facility] Use [email or web address] to contact on call provider  Daily Billing: [id number] - post op

## 2023-05-15 NOTE — Discharge Instructions (Addendum)
Continue a soft diet at home for at least 2 weeks then can advance to regular food slowly while monitoring tolerance.  [facility]  Please arrive at least 30 min before your appointment to complete your check in paperwork.  If you are unable to arrive 30 min prior to your appointment time we may have to cancel or reschedule you. LAPAROSCOPIC SURGERY: POST OP INSTRUCTIONS Always review your discharge instruction sheet given to you by the facility where your surgery was performed. IF YOU HAVE DISABILITY OR FAMILY LEAVE FORMS, YOU MUST BRING THEM TO THE OFFICE FOR PROCESSING.   DO NOT GIVE THEM TO YOUR DOCTOR.  PAIN CONTROL  First take acetaminophen (Tylenol) to control your pain after surgery.  Follow directions on package.  Taking acetaminophen (Tylenol) regularly after surgery will help to control your pain and lower the amount of prescription pain medication you may need.  You should not take more than 3,000 mg (3 grams) of acetaminophen (Tylenol) in 24 hours. Please make sure to look at all of your medications to ensure they do not also contain  acetaminophen (Tylenol), and you do not exceed 3,000 mg (3 grams) of acetaminophen (Tylenol) in 24 hours. You should not take ibuprofen (Advil), aleve, motrin, naprosyn or other NSAIDS if you have a history of stomach ulcers, stomach surgery or chronic kidney disease.  A prescription for pain medication may be given to you upon discharge.  Take your pain medication as prescribed, if you still have uncontrolled pain after taking acetaminophen (Tylenol) Use ice packs to help control pain. If you need a refill on your pain medication, please contact your pharmacy.  They will contact our office to request authorization. Prescriptions will not be filled after 5pm or on week-ends.  HOME MEDICATIONS Take your usually prescribed medications unless otherwise directed.  DIET Please see handout below.  Follow a soft diet for two weeks at  home and then advance diet as tolerated. Be sure to include lots of fluids daily. Avoid fatty, fried foods.   CONSTIPATION It is common to experience some constipation after surgery and if you are taking pain medication.  Increasing fluid intake and taking a stool softener (such as Colace) will usually help or prevent this problem from occurring.  A mild laxative (Milk of Magnesia or Miralax) should be taken according to package instructions if there are no bowel movements after 48 hours.  WOUND/INCISION CARE Most patients will experience some swelling and bruising in the area of the incisions.  Ice packs will help.  Swelling and bruising can take several days to resolve.  Unless discharge instructions indicate otherwise, follow guidelines below  STERI-STRIPS - you may remove your outer bandages 48 hours after surgery, and you may shower at that time.  You have steri-strips (small skin tapes) in place directly over the incision.  These strips should be left on the skin for 7-10 days.   DERMABOND/SKIN GLUE - you may shower in 24 hours.  The glue will flake off over the next 2-3 weeks. Any sutures or staples will be removed at the office during your follow-up visit.  ACTIVITIES You may resume regular (light) daily activities beginning the next day--such as daily self-care, walking, climbing stairs--gradually increasing activities as tolerated.  You may have sexual intercourse when it is comfortable.  Refrain from any heavy lifting or straining until approved by your doctor. You may drive when you are no longer taking prescription pain medication, you can comfortably wear a seatbelt,  and you can safely maneuver your car and apply brakes.  FOLLOW-UP You should see your doctor in the office for a follow-up appointment approximately 2-3 weeks after your surgery.  You should have been given your post-op/follow-up appointment when your surgery was scheduled.  If you did not receive a post-op/follow-up  appointment, make sure that you call for this appointment within a day or two after you arrive home to insure a convenient appointment time.   WHEN TO CALL YOUR DOCTOR: Fever over 101.0 Inability to urinate Continued bleeding from incision. Increased pain, redness, or drainage from the incision. Increasing abdominal pain  The clinic staff is available to answer your questions during regular business hours.  Please don't hesitate to call and ask to speak to one of the nurses for clinical concerns.  If you have a medical emergency, go to the nearest emergency room or call 911.  A surgeon from [facility] is always on call at the hospital. [address], [address], [place], [place]  [address] ? [address] [address], [place], [place]   [id number] [phone] ? [phone] ? FAX [phone]       Low Fiber Nutrition Therapy   You may need a low-fiber diet if you have Crohn's disease, diverticulitis, gastroparesis, ulcerative colitis, a new colostomy, or new ileostomy. A low-fiber diet may also be needed following radiation therapy to the pelvis and lower bowel or recent intestinal surgery.  A low-fiber diet reduces the frequency and volume of your stools. This lessens irritation to the gastrointestinal (GI) tract and can help you heal. Use this diet if you have a stricture so your intestine doesn't get blocked. The goal of this diet is to get less than 8 grams of fiber daily. It's also important to eat enough protein foods while you are on a low-fiber diet.  Drink nutrition supplements that have 1 gram of fiber or less in each serving. If your stricture is severe or if your inflammation is severe, drink more liquids to reduce symptoms and to get enough calories and protein.  Tips Eat about 5 to 6 small meals daily or about every 3 to 4 hours. Do not skip meals.  Every time you eat, include a small amount of protein (1 to 2 ounces) plus an additional food. Low fiber starch foods  are the best choice to eat with protein.  Limit acidic, spicy and high-fat or fried and greasy foods to reduce GI symptoms.  Do not eat raw fruits and vegetables while on this diet. All fruits and vegetables need to be cooked and without peels or skins.  Drink a lot of fluids, at least 8 cups of fluid each day. Limit drinks with caffeine, sugar, and sugar substitutes.  Plain water is the best choice. Avoid mixing drink packets or flavor drops into water. .  Take a chewable multivitamin with minerals. Gummy vitamins do not have enough minerals and can block an ostomy and non-chewable supplements are not easily digested. Chewable supplements must be used if you have a stricture or ostomy.  If you are lactose intolerant, you may need to eat low-lactose dairy products. If you can't tolerate dairy, ask your RDN about how you can get enough calcium from other foods.  Do not take a calcium supplement. They can cause a blockage.  It is important to add high-calcium foods gradually to your diet and monitor for symptoms to avoid a blockage.  Do not add more fiber to your diet until your  health care provider or registered dietitian nutritionist (RDN) tells you it's OK. Fiber is part of whole grains, fruits and vegetables (foods from plants) and needs to be slowly added back in to your diet when your body is healed.  Choose foods that have been safely handled and prepared to lower your risk of foodborne illness. Talk to your RDN or see the Food Safety Nutrition Therapy handout for more information.   Foods Recommended These foods are low in fat and fiber and will help with your GI symptoms. Food Group Foods Recommended  Grains  Choose grain foods with less than 2 grams of fiber per serving. Refined white flour products--for example, enriched white bread without seeds, crackers or pasta Cream of wheat or rice Grits (fine ground) Tortillas: white flour or corn White rice, well-cooked (do not rinse, or soak  before cooking) Cold and hot cereals made from white or refined flour such as puffed rice or corn flakes  Protein Foods  Lean, very tender, well-cooked poultry or fish; red meats: beef, pork or lamb (slow cook until soft; chop meats if you have stricture or ostomy) Eggs, well-cooked Smooth nut butters such as almond, peanut, or sunflower Tofu  Dairy  If you have lactose intolerance, drinking milk products from cows or goats may make diarrhea worse. Foods marked with an asterisk (*) have lactose. Milk: fat-free, 1% or 2% * (choose best tolerated) Lactose-free milk Buttermilk* Fortified non-dairy milks: almond, cashew, coconut, or rice (be aware that these options are not good sources of protein so you will need to eat an additional protein food) Kefir* (Don't include kefir in the diet until approved by your health care provider) Yogurt*/lactose-free yogurt (without nuts, fruit, granola or chocolate) Mild cheese* (hard and aged cheeses tend to be lower in lactose such as cheddar, swiss or parmesan) Cottage cheese* or lactose-free cottage cheese Low-fat ice cream* or lactose-free ice cream Sherbet* (usually lower lactose)  Vegetables  Canned and well-cooked vegetables without seeds, skins, or hulls  Carrots or green beans, cooked White, red or yellow potatoes without skins Strained vegetable juice  Fruit Soft, and well-cooked fruits without skins, seeds, or membranes Canned fruit in juice: peaches, pears, or applesauce Fruit juice without pulp diluted by half with water may be tolerated better Fruit drinks fortified with vitamin C may be tolerated better than 100% fruit juice  Oils  When possible, choose healthy oils and fats, such as olive and canola oils, plant oils rather than solid fats.  Other  Broth and strained soups made from allowed foods Desserts (small portions) without whole grains, seeds, nuts, raisins, or coconut Jelly (clear)   Foods Not Recommended These foods are higher  in fat and fiber and may make your GI symptoms worse.  Food Group Foods Not Recommended  Grains  Bread, whole wheat or with whole grain flour or seeds or nuts Brown rice, quinoa, kasha, barley Tortillas: whole grain Whole wheat pasta Whole grain and high-fiber cereals, including oatmeal, bran flakes or shredded wheat Popcorn  Protein Foods  Steak, pork chops, or other meats that are fatty or have gristle Fried meat, poultry, or fish Seafood with a tough or rubbery texture, such as shrimp Luncheon meats such as bologna and salami Sausage, bacon, or hot dogs Dried beans, peas, or lentils Hummus Sushi Nuts and chunky nut butters  Dairy  Whole milk Pea milk and soymilk (may cause diarrhea, gas, bloating, and abdominal pain) Cream Half-and-half Sour cream Yogurt with added fruit, nuts, or granola or  chocolate  Vegetables  Alfalfa or bean sprouts (high fiber and risk for bacteria) Raw or undercooked vegetables: beets; broccoli; brussels sprouts; cabbage; cauliflower; collard, mustard, or turnip greens; corn; cucumber; green peas or any kind of peas; kale; lima beans; mushrooms; okra; olives; pickles and relish; onions; parsnips; peppers; potato skins; sauerkraut; spinach; tomatoes  Fruit Raw fruit Dried fruit Avocado, berries, coconut Canned fruit in syrup Canned fruit with mandarin oranges, papaya or pineapple Fruit juice with pulp Prune juice Fruit skin  Oils  Pork rinds   Low-Fiber (8 grams) Sample 1-Day Menu  Breakfast  cup cream of wheat (0.5 gram fiber)  1 slice white toast (1 gram fiber)  1 teaspoon margarine, soft tub  2 scrambled eggs   Morning Snack 1 cup lactose-free nutrition supplement  Lunch 2 slices white bread (2 grams fiber)  3 tablespoons tuna  1 tablespoon mayonnaise  1 cup chicken noodle soup (1 gram fiber)   cup apple juice   Afternoon Snack 6 saltine crackers (0.5 gram fiber)  2 ounces low-fat cheddar cheese  Evening Meal 3 ounces tender chicken  breast  1 cup white rice (0.5 gram fiber)   cup cooked canned green beans (2 grams fiber)   cup cranberry juice   Evening Snack 1 cup lactose-free nutrition supplement   Copyright 2020  Academy of Nutrition and Dietetics  High Fiber Nutrition Therapy  Fiber and fluid may help you feel less constipated and bloated and can also help ease diarrhea. Increase fiber slowly over the course of a few weeks. This will keep your symptoms from getting worse.  Tips Tips for Adding Fiber to Your Eating Plan Slowly increase the amount of fiber you eat to 25 to 35 grams per day. Eat whole grain breads and cereals. Look for choices with 100% whole wheat, rye, oats, or bran as the first or second ingredient. Have brown or wild rice instead of white rice or potatoes. Enjoy a variety of grains. Good choices include barley, oats, farro, kamut, and quinoa. Bake with whole wheat flour. You can use it to replace some white or all-purpose flour in recipes. Enjoy baked beans more often! Add dried beans and peas to casseroles or soups. Choose fresh fruit and vegetables instead of juices. Eat fruits and vegetables with peels or skins on. Compare food labels of similar foods to find higher fiber choices. On packaged foods, the amount of fiber per serving is listed on the Nutrition Facts label. Check the Nutrition Facts labels and try to choose products with at least 4 g dietary fiber per serving. Drink plenty of fluids. Set a goal of at least 8 cups per day. You may need even more fluid as you eat higher amounts of fiber. Fluid helps your body process fiber without discomfort. Foods Recommended Foods With at Least 4 g Fiber per Serving Food Group Choose  Grains ?- cup high-fiber cereal  Dried beans and peas  cup cooked red beans, kidney beans, large lima beans, navy beans, pinto beans, white beans, lentils, or black-eyed peas  Vegetables 1 artichoke (cooked)  Fruits  cup blackberries or raspberries 4 dried  prunes   Foods With 1 to 3 g Fiber per Serving Food Group Choose  Grains 1 bagel (3.5-inch diameter) 1 slice whole wheat, cracked wheat, pumpernickel, or rye bread 2-inch square cornbread 4 whole wheat crackers 1 bran, blueberry, cornmeal, or English muffin  cup cereal with 1-3 g fiber per serving (check dietary fiber on the product's Nutrition Facts label) 2  tablespoons wheat germ or whole wheat flour  Fruits 1 apple (3-inch diameter) or  cup applesauce  cup apricots (canned) 1 banana  cup cherries (canned or fresh)  cup cranberries (fresh) 3 dates 2 medium figs (fresh)  cup fruit cocktail (canned)  grapefruit 1 kiwi fruit 1 orange (2-inch diameter) 1 peach (fresh) or  cup peaches (canned) 1 pear (fresh) or  cup pears (canned) 1 plum (2-inch diameter)  cup raisins  cup strawberries (fresh) 1 tangerine  Vegetables  cup bean sprouts (raw)  cup beets (diced, canned)  cup broccoli, brussels sprouts, or cabbage  (cooked)  cup carrots  cup cauliflower  cup corn  cup eggplant  cup okra (boiled)  cup potatoes (baked or mashed)  cup spinach, kale, or turnip greens (cooked)  cup squash--winter, summer, or zucchini (cooked)  cup sweet potatoes or yams  cup tomatoes (canned)  Other 2 tablespoons almonds or peanuts 1 cup popcorn (popped)   High Fiber Vegetarian (Lacto-Ovo) Sample 1-Day Menu  Breakfast  cup bran cereal  1 banana  cup blueberries 1 cup 1% milk  Lunch 2 slices whole wheat bread  2 tablespoons hummus 1 ounce cheddar cheese 1 leaf lettuce 2 slices tomato  cup vegetarian baked beans 1 orange 1 cup 1% milk  Evening Meal Stir fry made with:  cup tempeh   cup brown rice 1 cup frozen broccoli 1 tablespoon soy sauce  cup peanuts  1 pear  Evening Snack 6 ounces fruit yogurt  1 cup air popped popcorn  High Fiber Vegan Sample 1-Day Menu  Breakfast  cup bran cereal  1 banana  cup blueberries 1 cup soymilk fortified with  calcium, vitamin B12, and vitamin D  Lunch  cup chili with beans with:   cup tempeh crumbles  cup crushed whole wheat crackers 1 apple 1 cup soymilk fortified with calcium, vitamin B12, and vitamin D  Evening Meal 1 veggie burger  1 whole wheat bun  1 leaf lettuce  1 slice tomato Salad made with: 1 cup lettuce  cup chickpeas   cucumbers 1 tablespoon italian dressing 1 cup strawberries   Evening Snack  cup almonds  1 cup carrot sticks  High Fiber Sample 1-Day Menu  Breakfast 1/2 cup orange juice, with pulp  1/2 cup raisin bran 1 cup fat-free milk 1 cup coffee  Morning Snack 1 cup plain yogurt  2 cups water  Lunch 1 1/2 cups chili  1/2 cup kidney beans 1/2 cup soy crumble 2 tablespoons shredded cheese 8 whole wheat crackers 1 apple (with skin)   Evening Meal 2 ounces sliced chicken  1/4 cup tofu 2 cups mixed fresh vegetables 1 cup brown rice 1/2 cup strawberries 1 cup hot tea  Evening Snack 2 tablespoons almonds  1 cup hot chocolate   Copyright 2020  Academy of Nutrition and Dietetics

## 2023-05-15 NOTE — Care Management Important Message (Signed)
Important Message  Patient Details  Name: Candace Manning MRN: [id number] Date of Birth: 1946/02/26   Medicare Important Message Given:  Yes     Dorena Bodo 05/15/2023, 3:44 PM

## 2023-05-15 NOTE — Evaluation (Signed)
Physical Therapy Evaluation Patient Details Name: Candace Manning MRN: [id number] DOB: Dec 13, 1945 Today's Date: 05/15/2023  History of Present Illness  Pt is 77 year old presented to [facility] on  05/12/23 for hiatal hernia with gastric volvulus. Underwent laparoscopic reduction of volvulus and hernia repair on 05/14/23. PMH - ovarian CA, dementia, depression, hemicolectomy  Clinical Impression  Pt able to amb in hallway with hand held assist. Not too far from baseline and family can provide support needed. Expect return to baseline in home environment with family support.  Ready for dc from PT standpoint.        Recommendations for follow up therapy are one component of a multi-disciplinary discharge planning process, led by the attending physician.  Recommendations may be updated based on patient status, additional functional criteria and insurance authorization.  Follow Up Recommendations       Assistance Recommended at Discharge Frequent or constant Supervision/Assistance  Patient can return home with the following  A little help with walking and/or transfers;A little help with bathing/dressing/bathroom;Direct supervision/assist for medications management;Help with stairs or ramp for entrance;Assist for transportation;Direct supervision/assist for financial management    Equipment Recommendations    Recommendations for Other Services       Functional Status Assessment Patient has not had a recent decline in their functional status     Precautions / Restrictions Precautions Precautions: Fall      Mobility  Bed Mobility Overal bed mobility: Needs Assistance Bed Mobility: Supine to Sit, Sit to Supine     Supine to sit: Min assist Sit to supine: Min guard   General bed mobility comments: Assist to elevate trunk into sitting    Transfers Overall transfer level: Needs assistance   Transfers: Sit to/from Stand Sit to Stand: Min assist           General transfer comment:  Assist for balance and to power up    Ambulation/Gait Ambulation/Gait assistance: Min assist Gait Distance (Feet): 100 Feet Assistive device: 1 person hand held assist Gait Pattern/deviations: Step-through pattern, Decreased stride length, Narrow base of support Gait velocity: decr Gait velocity interpretation: 1.31 - 2.62 ft/sec, indicative of limited community ambulator   General Gait Details: Assist for balance and support  Stairs            Wheelchair Mobility    Modified Rankin (Stroke Patients Only)       Balance Overall balance assessment: Needs assistance Sitting-balance support: No upper extremity supported, Feet supported Sitting balance-Leahy Scale: Fair     Standing balance support: Single extremity supported Standing balance-Leahy Scale: Poor Standing balance comment: UE support                             Pertinent Vitals/Pain Pain Assessment Pain Assessment: Faces Faces Pain Scale: Hurts little more Pain Location: abdomen Pain Descriptors / Indicators: Grimacing, Guarding Pain Intervention(s): Limited activity within patient's tolerance, Repositioned    Home Living Family/patient expects to be discharged to:: Private residence Living Arrangements: Spouse/significant other Available Help at Discharge: Family;Available 24 hours/day Type of Home: House Home Access: Stairs to enter   [employer] of Steps: 1   Home Layout: One level (1 step down into den) Home Equipment: None      Prior Function Prior Level of Function : Needs assist  Cognitive Assist : Mobility (cognitive);ADLs (cognitive) Mobility (Cognitive): Intermittent cues ADLs (Cognitive): Step by step cues Physical Assist : Mobility (physical) Mobility (physical): Transfers;Gait   Mobility  Comments: Assist with amb as needed       Hand Dominance        Extremity/Trunk Assessment   Upper Extremity Assessment Upper Extremity Assessment: Overall WFL  for tasks assessed    Lower Extremity Assessment Lower Extremity Assessment: Overall WFL for tasks assessed       Communication   Communication: HOH  Cognition Arousal/Alertness: Awake/alert Behavior During Therapy: Flat affect Overall Cognitive Status: History of cognitive impairments - at baseline                                 General Comments: History of dementia        General Comments      Exercises     Assessment/Plan    PT Assessment Patient does not need any further PT services  PT Problem List         PT Treatment Interventions      PT Goals (Current goals can be found in the Care Plan section)  Acute Rehab PT Goals PT Goal Formulation: All assessment and education complete, DC therapy    Frequency       Co-evaluation               AM-PAC PT "6 Clicks" Mobility  Outcome Measure Help needed turning from your back to your side while in a flat bed without using bedrails?: A Little Help needed moving from lying on your back to sitting on the side of a flat bed without using bedrails?: A Little Help needed moving to and from a bed to a chair (including a wheelchair)?: A Little Help needed standing up from a chair using your arms (e.g., wheelchair or bedside chair)?: A Little Help needed to walk in hospital room?: A Little Help needed climbing 3-5 steps with a railing? : A Little 6 Click Score: 18    End of Session Equipment Utilized During Treatment: Gait belt Activity Tolerance: Patient tolerated treatment well Patient left: in bed;with call bell/phone within reach;with bed alarm set;with family/visitor present Nurse Communication: Mobility status PT Visit Diagnosis: Other abnormalities of gait and mobility (R26.89)    Time: 2956-2130 PT Time Calculation (min) (ACUTE ONLY): 20 min   Charges:   PT Evaluation $PT Eval Low Complexity: 1 Low          [facility] PT Acute Rehabilitation Services Office  [phone]   Angelina Ok [facility] 05/15/2023, 12:50 PM

## 2023-05-15 NOTE — Progress Notes (Signed)
PROGRESS NOTE    Candace Manning  ZOX:[id number] DOB: 1946/11/17 DOA: 05/12/2023 PCP: Gweneth Dimitri, MD   Brief Narrative:  77 y.o. female with medical history significant for severe Alzheimer dementia, essential pretension, and ovarian cancer status post right hemicolectomy with reanastomosis, GAD, GERD, hiatal hernia, hyperlipidemia, who is admitted to [facility] on 05/12/2023 by way of transfer from [facility] with concern for gastric volvulus with associated obstruction after presenting from home to the latter facility for evaluation of nausea/vomiting.  CT abdomen pelvis was consistent with large hiatal hernia with concerns of gastric volvulus/obstruction.  NG tube was placed, general surgery was consulted.  Eventually patient underwent laparoscopic reduction of gastric volvulus, hiatal hernia repair and gastropexy on 6/6.  TRANSFERRED TO GEN SURGERY SERVICE. Appreciate their help. Medicine will continue to follow for any needs.   Assessment & Plan:  Principal Problem:   Volvulus (HCC) Active Problems:   GERD (gastroesophageal reflux disease)   SIRS (systemic inflammatory response syndrome) (HCC)   Lactic acidosis   Hypercalcemia   Essential hypertension   GAD (generalized anxiety disorder)   Allergic rhinitis   Hyperlipidemia   Small bowel obstruction (HCC)    Gastric volvulus status post laparoscopic reduction - Patient underwent laparoscopic reduction of gastric volvulus, hiatal hernia repair and gastropexy on 6/6.  Postop management per general surgery.  Hypokalemia - As needed repletion  Large hiatal hernia - PPI  Moderate dehydration/SIRS Hypercalcemia Lactic acidosis; resolved. - Aggressive IV fluids  Essential hypertension - Home meds on hold.  IV as needed  GERD - PPI  Hyperlipidemia -Home meds on hold  History of Alzheimer's - Supportive care  TRANSFERRED TO GEN SURGERY SERVICE. Appreciate their help.  Medicine will continue to follow for  any needs.       Diet Orders (From admission, onward)     Start     Ordered   05/14/23 1459  Diet clear liquid Fluid consistency: Thin  Diet effective now       Question:  Fluid consistency:  Answer:  Thin   05/14/23 1458            Subjective: Patient is doing okay, tolerating clears during my visit this morning.  Family is at bedside.  Examination: Constitutional: Not in acute distress Respiratory: Clear to auscultation bilaterally Cardiovascular: Normal sinus rhythm, no rubs Abdomen: Nontender nondistended good bowel sounds Musculoskeletal: No edema noted Skin: Surgical incision without any evidence of active bleeding or infection Neurologic: CN 2-12 grossly intact.  And nonfocal Psychiatric: Pleasantly confused.  Alert to name only   Objective: Vitals:   05/14/23 1553 05/14/23 2006 05/15/23 0047 05/15/23 0520  BP: (!) 170/106 (!) 157/85 136/79 (!) 140/75  Pulse: 80 88 86 80  Resp: 18 18 18 18   Temp: 97.7 F (36.5 C) 98.3 F (36.8 C) 98.8 F (37.1 C) 98.7 F (37.1 C)  TempSrc: Oral     SpO2: 99%  97% 94%  Weight:      Height:        Intake/Output Summary (Last 24 hours) at 05/15/2023 0803 Last data filed at 05/14/2023 1400 Gross per 24 hour  Intake 1600 ml  Output 50 ml  Net 1550 ml   Filed Weights   05/12/23 1449 05/14/23 0944  Weight: 59 kg 58.9 kg    Scheduled Meds:  donepezil  10 mg Oral QHS   escitalopram  20 mg Oral Daily   memantine  10 mg Oral BID   pantoprazole (PROTONIX) IV  40 mg  Intravenous Q12H   QUEtiapine  75 mg Oral QHS   Continuous Infusions:  dextrose 5 % and 0.45 % NaCl 75 mL/hr at 05/15/23 0517    Nutritional status     Body mass index is 21.61 kg/m.  Data Reviewed:   CBC: Recent Labs  Lab 05/12/23 1451 05/13/23 0737 05/14/23 0755  WBC 18.3* 14.5* 14.5*  NEUTROABS  --  7.2  --   HGB 16.2* 13.1 12.7  HCT 48.1* 40.6 38.1  MCV 92.3 94.6 95.3  PLT 370 289 193   Basic Metabolic Panel: Recent Labs  Lab  05/12/23 1451 05/13/23 0737 05/14/23 0755  NA 141 142 136  K 3.6 3.0* 3.3*  CL 97* 95* 99  CO2 28 31 25   GLUCOSE 144* 108* 104*  BUN 16 20 14   CREATININE 0.84 0.97 0.92  CALCIUM 10.6* 9.6 8.3*  MG 2.1 2.0 1.8  PHOS  --  5.0*  --    GFR: Estimated Creatinine Clearance: 47.5 mL/min (by C-G formula based on SCr of 0.92 mg/dL). Liver Function Tests: Recent Labs  Lab 05/12/23 1451  AST 21  ALT 19  ALKPHOS 67  BILITOT 0.9  PROT 8.0  ALBUMIN 5.0   Recent Labs  Lab 05/12/23 1451  LIPASE 19   No results for input(s): "AMMONIA" in the last 168 hours. Coagulation Profile: Recent Labs  Lab 05/12/23 2025  INR 1.0   Cardiac Enzymes: Recent Labs  Lab 05/12/23 2339  CKTOTAL 97   BNP (last 3 results) No results for input(s): "PROBNP" in the last 8760 hours. HbA1C: No results for input(s): "HGBA1C" in the last 72 hours. CBG: Recent Labs  Lab 05/13/23 1236 05/14/23 0503 05/14/23 0950 05/15/23 0203 05/15/23 0527  GLUCAP 110* 110* 86 136* 135*   Lipid Profile: No results for input(s): "CHOL", "HDL", "LDLCALC", "TRIG", "CHOLHDL", "LDLDIRECT" in the last 72 hours. Thyroid Function Tests: No results for input(s): "TSH", "T4TOTAL", "FREET4", "T3FREE", "THYROIDAB" in the last 72 hours. Anemia Panel: No results for input(s): "VITAMINB12", "FOLATE", "FERRITIN", "TIBC", "IRON", "RETICCTPCT" in the last 72 hours. Sepsis Labs: Recent Labs  Lab 05/12/23 2025 05/12/23 2339  LATICACIDVEN 3.2* 1.5    Recent Results (from the past 240 hour(s))  Culture, blood (Routine X 2) w Reflex to ID Panel     Status: None (Preliminary result)   Collection Time: 05/12/23  8:25 PM   Specimen: BLOOD LEFT ARM  Result Value Ref Range Status   Specimen Description   Final    BLOOD LEFT ARM Performed at [facility], [address], [place], [place] [address]    Special Requests   Final    BOTTLES DRAWN AEROBIC AND ANAEROBIC Blood Culture results may not be optimal due to an  inadequate volume of blood received in culture bottles Performed at [facility], [address] [place], [place] [address]    Culture   Final    NO GROWTH 2 DAYS Performed at [facility], [address] [place], [place] [address]    Report Status PENDING  Incomplete  Culture, blood (Routine X 2) w Reflex to ID Panel     Status: None (Preliminary result)   Collection Time: 05/12/23  8:25 PM   Specimen: BLOOD RIGHT ARM  Result Value Ref Range Status   Specimen Description   Final    BLOOD RIGHT ARM Performed at [facility], [address], [place], [place] [address]    Special Requests   Final  BOTTLES DRAWN AEROBIC AND ANAEROBIC Blood Culture adequate volume Performed at [facility], [address] [place], [place] [address]    Culture   Final    NO GROWTH 2 DAYS Performed at [facility], [address] [place], [place] [address]    Report Status PENDING  Incomplete         Radiology Studies: No results found.         LOS: 3 days   Time spent= 35 mins    Kartier Bennison Joline Maxcy, MD [facility]  If 7PM-7AM, please contact night-coverage  05/15/2023, 8:03 AM

## 2023-05-15 NOTE — Progress Notes (Signed)
OT Cancellation Note and Discharge  Patient Details Name: Candace Manning MRN: [id number] DOB: January 25, 1946   Cancelled Treatment:    Reason Eval/Treat Not Completed: OT screened, no needs identified, will sign off. Received secure chat from evaluating PT that pt has dementia and is not quite at her baseline mobility wise and needs care for most ADLs and has has a wonderful supportive family to A as needed.  Lindon Romp OT Acute Rehabilitation Services Office [phone]   Evette Georges 05/15/2023, 12:53 PM

## 2023-05-15 NOTE — Discharge Summary (Addendum)
[facility] Discharge Summary   Patient ID: Candace Manning MRN: [id number] DOB/AGE: 1946/09/22 77 y.o.  Admit date: 05/12/2023 Discharge date: 05/15/2023  Admitting Diagnosis: Volvulus (HCC) [K56.2] Other chest pain [R07.89] Small bowel obstruction (HCC) [K56.609] HTN GERD Alzheimer's dementia Hiatal hernia  Discharge Diagnosis Volvulus (HCC) [K56.2] Other chest pain [R07.89] Small bowel obstruction (HCC) [K56.609] HTN GERD Alzheimer's dementia  Consultants [facility]  Imaging: No results found.  Procedures Dr. Dossie Der (05/14/23) LAPAROSCOPIC GASTROPEXY LAPAROSCOPIC HIATAL HERNIA REPAIR WITH MESH INSERTION OF MESH 05/14/2023  Hospital Course:  77 y.o. female who presented to [facility] ED on 6/4 with nausea/vomiting.  Workup showed massive hiatal hernia with gastric volvulus.  Patient was transferred to [facility] and admitted to the hospitalist service and underwent procedure listed above.  Tolerated procedure well and was transferred to the floor. After discussion with hospitalist team general surgery took over as primary service. On POD1, the patient was voiding well, tolerating diet, working well with therapies (no PT f/u recommended, OT screened and s/o), pain well controlled, vital signs stable, incisions c/d/i and felt stable for discharge home with the support of her husband (caregiver) and other family. She and her husband were instructed to continue a soft diet for two weeks at home, as she may have some dysphagia, then advance diet as tolerated. Patient will follow up in our office in 4 weeks and knows to call with questions or concerns.    I or a member of my team have reviewed this patient in the Controlled Substance Database.  I was not directly involved in this patient's care therefore the information in this discharge summary was taken from the chart.  Allergies as of 05/15/2023       Reactions   Lamisil [terbinafine] Rash         Medication List     STOP taking these medications    clobetasol ointment 0.05 % Commonly known as: TEMOVATE   esomeprazole 40 MG capsule Commonly known as: NEXIUM       TAKE these medications    busPIRone 15 MG tablet Commonly known as: BUSPAR 2   qam   1  At diiner   calcium-vitamin D 250-100 MG-UNIT tablet Take 1 tablet by mouth 2 (two) times daily.   clonazePAM 0.5 MG tablet Commonly known as: KlonoPIN Take 1 tablet (0.5 mg total) by mouth 2 (two) times daily. What changed:  when to take this reasons to take this   docusate sodium 100 MG capsule Commonly known as: Colace Take 1 capsule (100 mg total) by mouth daily as needed for up to 7 days for mild constipation.   donepezil 10 MG tablet Commonly known as: ARICEPT Take 10 mg by mouth at bedtime.   EpiPen 2-Pak 0.3 mg/0.3 mL Soaj injection Generic drug: EPINEPHrine Inject 0.3 mg into the muscle daily as needed for anaphylaxis.   escitalopram 20 MG tablet Commonly known as: LEXAPRO Take 20 mg by mouth daily.   fexofenadine 180 MG tablet Commonly known as: ALLEGRA Take 180 mg by mouth daily.   HYDROcodone-acetaminophen 5-325 MG tablet Commonly known as: NORCO/VICODIN Take 1 tablet by mouth every 6 (six) hours as needed for severe pain.   ipratropium 0.06 % nasal spray Commonly known as: ATROVENT Place 2 sprays into both nostrils daily as needed for rhinitis.   latanoprost 0.005 % ophthalmic solution Commonly known as: XALATAN Place 1 drop into both eyes at bedtime.   levocetirizine 5 MG tablet Commonly known as: XYZAL  Take 5 mg by mouth every evening.   memantine 10 MG tablet Commonly known as: NAMENDA Take 10 mg by mouth 2 (two) times daily.   mirtazapine 30 MG tablet Commonly known as: Remeron Take 1 tablet (30 mg total) by mouth at bedtime.   MULTIVITAMIN PO Take 1 tablet by mouth daily.   pantoprazole 40 MG tablet Commonly known as: PROTONIX Take 1 tablet (40 mg total) by mouth 2  (two) times daily.   SEROquel 50 MG tablet Generic drug: QUEtiapine Take 50 mg by mouth at bedtime.   QUEtiapine 25 MG tablet Commonly known as: SEROQUEL Take 25 mg by mouth at bedtime.   sertraline 50 MG tablet Commonly known as: Zoloft Take 1 tablet (50 mg total) by mouth daily.   vitamin B-12 100 MCG tablet Commonly known as: CYANOCOBALAMIN Take 100 mcg by mouth daily.   VITAMIN D PO Take 1 tablet by mouth daily.          Follow-up Information     Stechschulte, Hyman Hopes, MD. Go on 06/17/2023.   Specialty: Surgery Why: 06/17/23 at 3:25 pm., Please arrive 30 minutes early to complete check in, and bring photo ID and insurance card. Contact information: [address] [place] [address] [phone]         Gweneth Dimitri, MD. Call.   Specialty: Family Medicine Why: As needed Contact information: [address] [place] [address] [phone]                 Signed: Jacinto Halim , [facility] 05/15/2023, 2:40 PM Please see Amion for pager number during day hours 7:00am-4:30pm

## 2023-05-15 NOTE — Progress Notes (Signed)
Initial Nutrition Assessment  DOCUMENTATION CODES:   Not applicable  INTERVENTION:  Encourage adequate PO intake Ensure Enlive po BID, each supplement provides 350 kcal and 20 grams of protein. Provided low fiber diet education following hernia/volvulus repair "Low Fiber Nutrition Therapy" handout added to AVS  NUTRITION DIAGNOSIS:   Increased nutrient needs related to acute illness, post-op healing as evidenced by estimated needs.  GOAL:   Patient will meet greater than or equal to 90% of their needs  MONITOR:   PO intake, Supplement acceptance, Diet advancement, Labs, Weight trends  REASON FOR ASSESSMENT:   Malnutrition Screening Tool    ASSESSMENT:   77 y.o. female admits related to nausea/vomiting. PMH includes: Alzheimer dementia, ovarian cancer status post right hemicolectomy with reanastomosis, GAD, GERD, hiatal hernia, and HLD. Pt is currently receiving medical management related to large hiatal hernia and gastric volvulus/obstruction  6/6 s/p lap reduction of gastric volvulus, lap hiatal hernia repair with bio-a-mesh, lap sutured gastropexy  Pt is very pleasant sitting in bed, visiting with family who assisted to provide nutrition related history as pt is oriented x1 and hard of hearing. Pt endorses feeling hungry. She had consumed the jello from her breakfast tray but did not enjoy the broth or juice.   PTA they report that she was eating well including foods such as burgers and sweets. She was not having any difficulty chewing/swallowing foods.   They report having she has been able to gain weight recently. Unfortunately, there is limited weight history on file to review within the last year. Current weight is 58.9 kg.   Medications: protonix, IV D5 and NaCl @ 64ml/hr   Labs: CBG's 86-136 x24 hours  NUTRITION - FOCUSED PHYSICAL EXAM:  Flowsheet Row Most Recent Value  Orbital Region No depletion  Upper Arm Region Mild depletion  Thoracic and Lumbar Region  No depletion  Buccal Region Mild depletion  Temple Region No depletion  Clavicle Bone Region No depletion  Clavicle and Acromion Bone Region No depletion  Scapular Bone Region No depletion  Dorsal Hand Moderate depletion  Patellar Region No depletion  Anterior Thigh Region No depletion  Posterior Calf Region No depletion  Edema (RD Assessment) None  Hair Reviewed  Eyes Reviewed  Mouth Reviewed  Skin Reviewed  Nails Reviewed       Diet Order:   Diet Order             Diet full liquid Fluid consistency: Thin  Diet effective now                   EDUCATION NEEDS:   Education needs have been addressed  Skin:  Skin Assessment: Skin Integrity Issues: Skin Integrity Issues:: Incisions Incisions: closed abdominal incision  Last BM:  6/6 - type 7  Height:   Ht Readings from Last 1 Encounters:  05/14/23 5\' 5"  (1.651 m)    Weight:   Wt Readings from Last 1 Encounters:  05/14/23 58.9 kg   BMI:  Body mass index is 21.61 kg/m.  Estimated Nutritional Needs:   Kcal:  1770- 2060 kcals  Protein:  90-100 gm  Fluid:  >/= 1.7 L  Drusilla Kanner, RDN, LDN Clinical Nutrition

## 2023-05-15 NOTE — Progress Notes (Signed)
Transition of Care [facility]) - Inpatient Brief Assessment   Patient Details  Name: Candace Manning MRN: [id number] Date of Birth: [birth year]  [facility] CM/SW Contact:    Janae Bridgeman, RN Phone Number: 05/15/2023, 2:27 PM   Clinical Narrative: No TOC needs noted at this time.  CM will continue to follow.  Please reach out for assistance from CM or LCSW if needed.   Transition of Care Asessment: Insurance and Status: (P) Insurance coverage has been reviewed Patient has primary care physician: (P) Yes Home environment has been reviewed: (S) (P) Yes Prior level of function:: (P) Independent   - home with husband Prior/Current Home Services: (P) No current home services Social Determinants of Health Reivew: (P) SDOH reviewed no interventions necessary Readmission risk has been reviewed: (P) Yes Transition of care needs: (P) no transition of care needs at this time

## 2023-05-16 LAB — CULTURE, BLOOD (ROUTINE X 2): Special Requests: ADEQUATE

## 2023-05-17 LAB — CULTURE, BLOOD (ROUTINE X 2): Culture: NO GROWTH

## 2023-12-09 IMAGING — MG MM DIGITAL SCREENING BILAT W/ TOMO AND CAD
8 series · 8 of 24 positions shown · non-contrast
Comparison: Previous exam(s).

CLINICAL DATA: Screening.

EXAM:
DIGITAL SCREENING BILATERAL MAMMOGRAM WITH TOMOSYNTHESIS AND CAD
TECHNIQUE: Bilateral screening digital craniocaudal and mediolateral oblique
mammograms were obtained. Bilateral screening digital breast
tomosynthesis was performed. The images were evaluated with
computer-aided detection.

[R CC synth-2D]
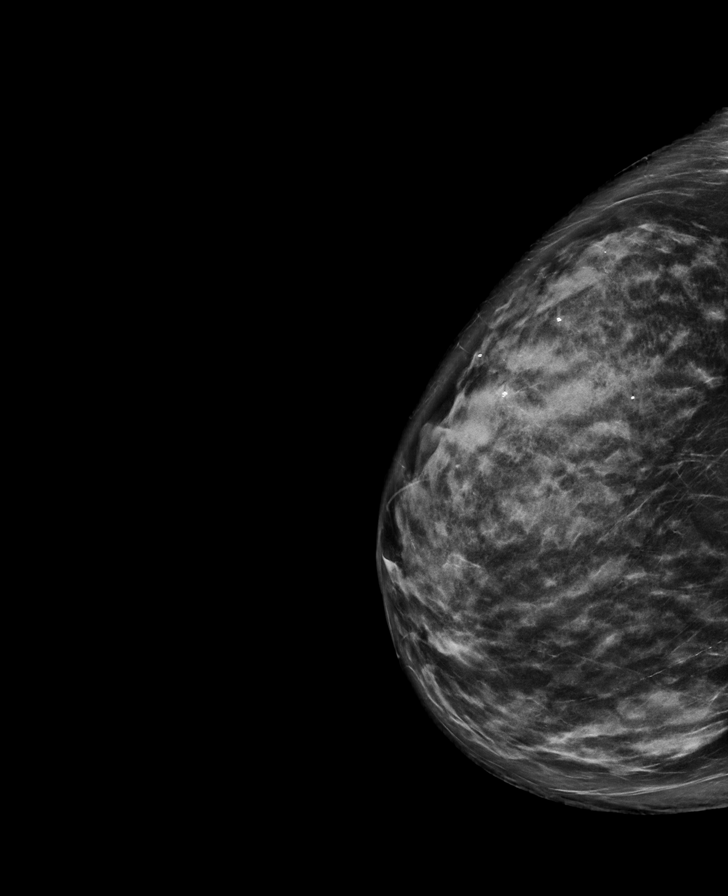

[L MLO synth-2D]
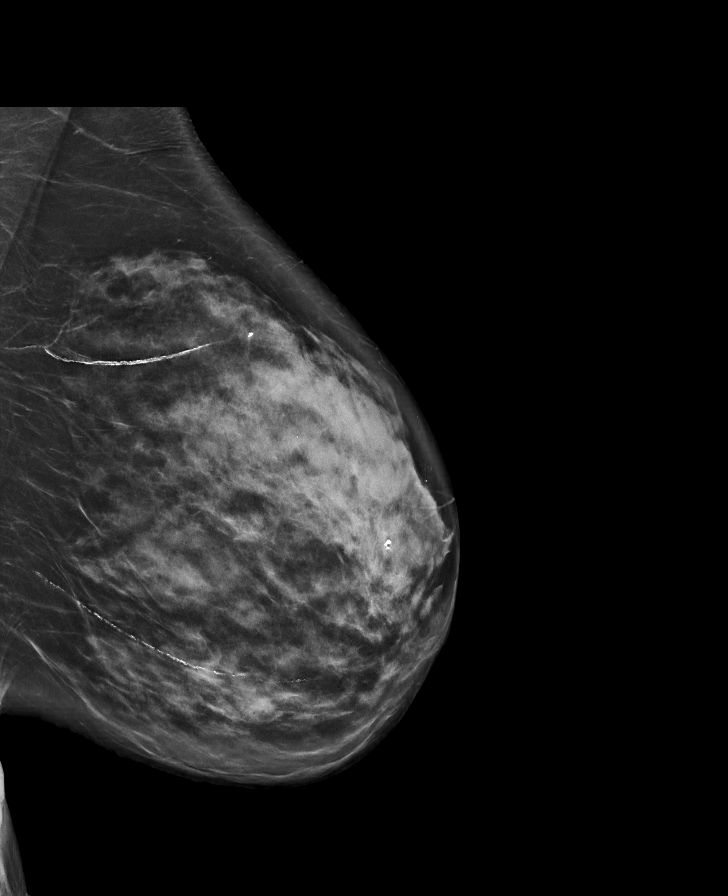

[R MLO synth-2D]
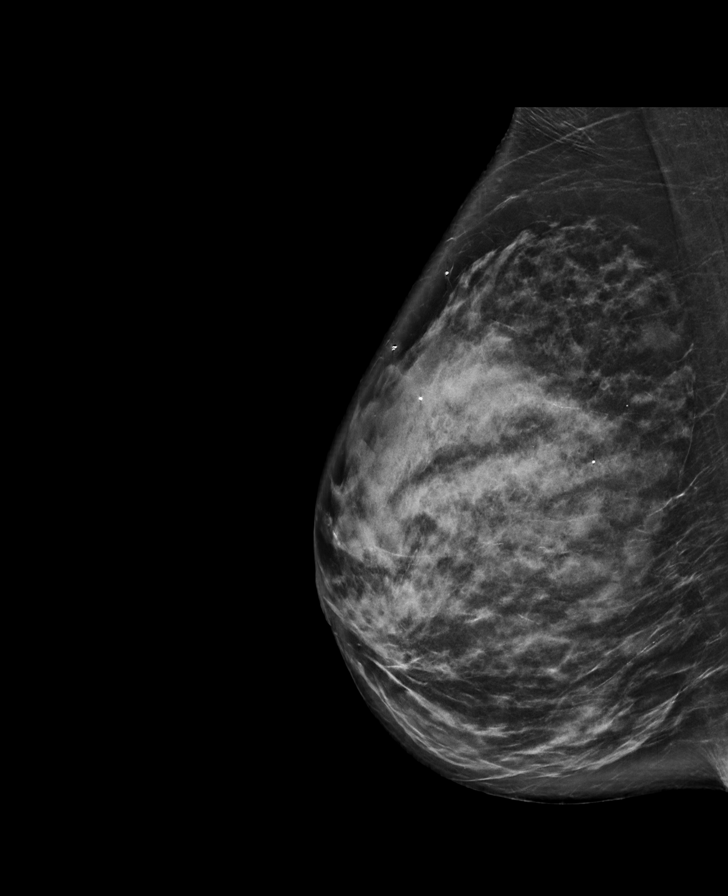

[L CC synth-2D]
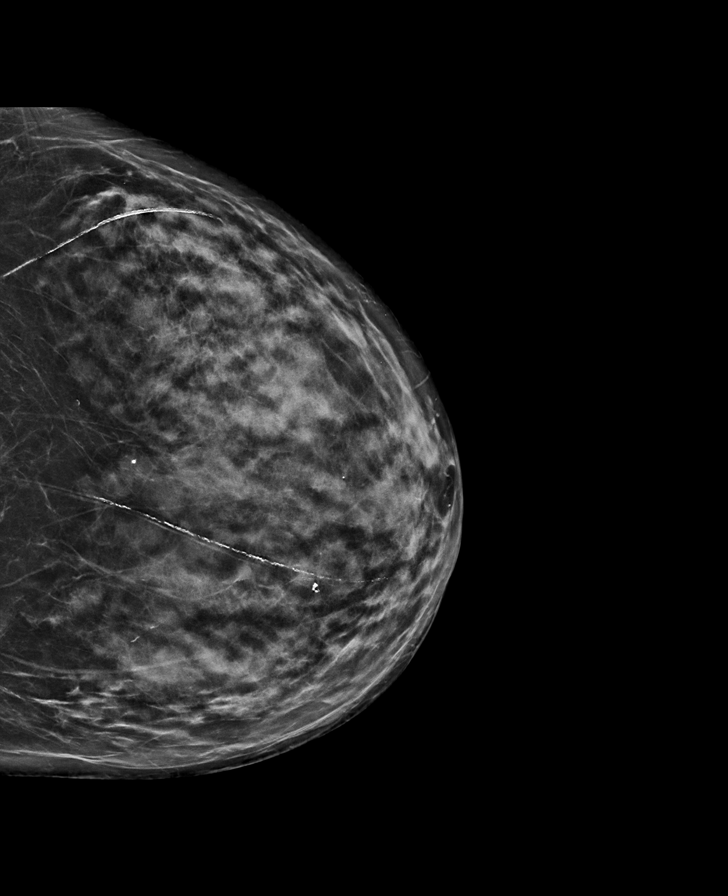

[L CC tomo · tomo slice 37/72.0]
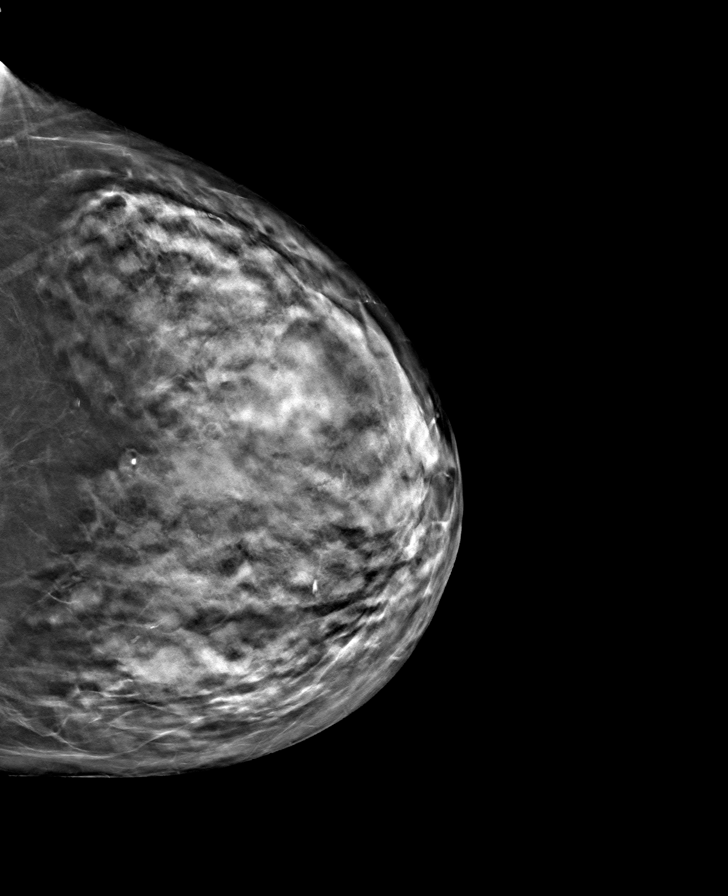

[L MLO tomo · tomo slice 43/86.0]
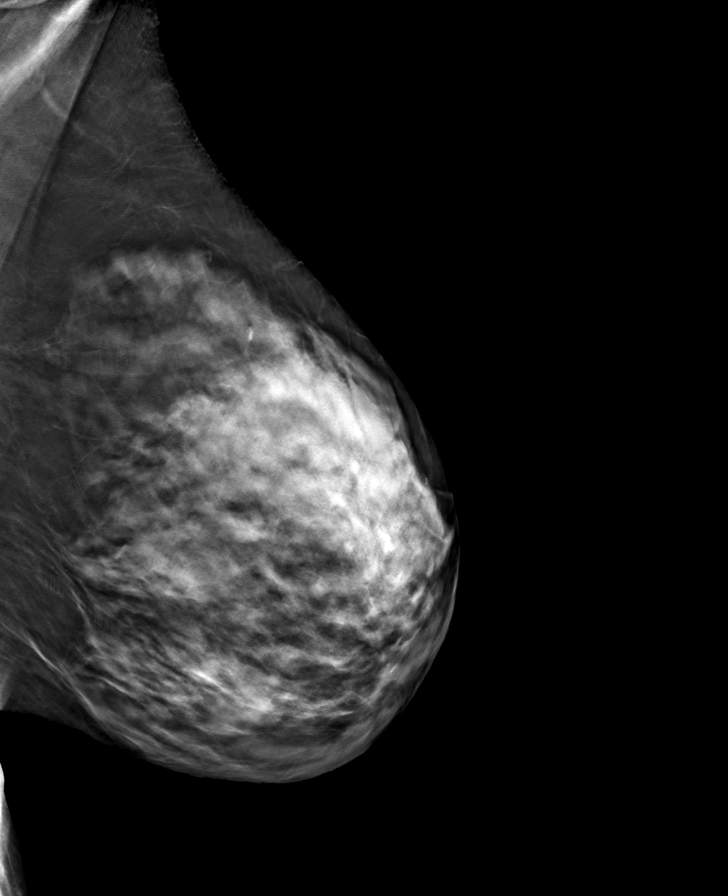

[R CC tomo · tomo slice 39/77.0]
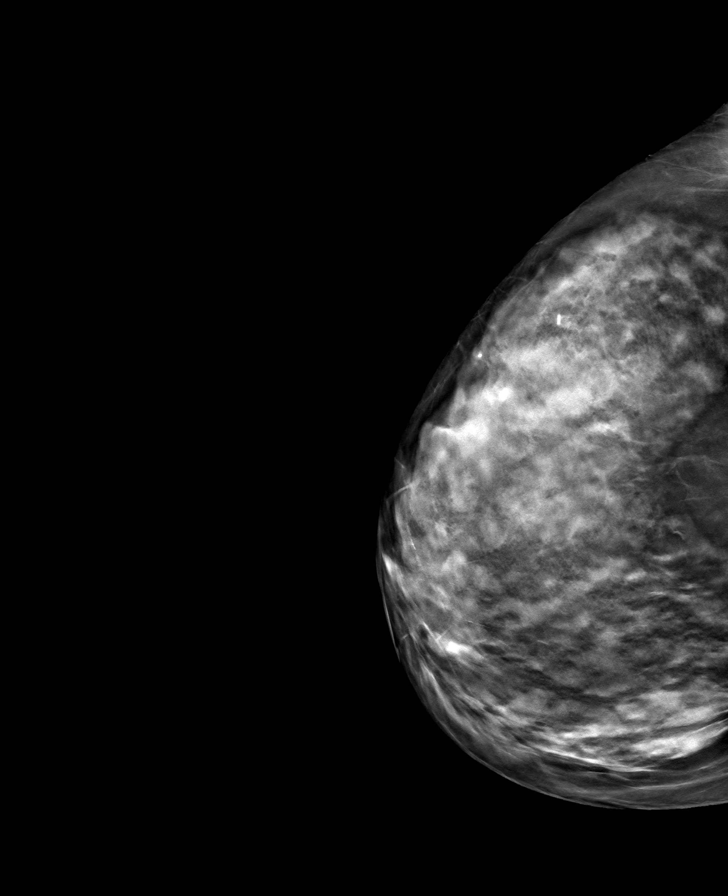

[R MLO tomo · tomo slice 39/76.0]
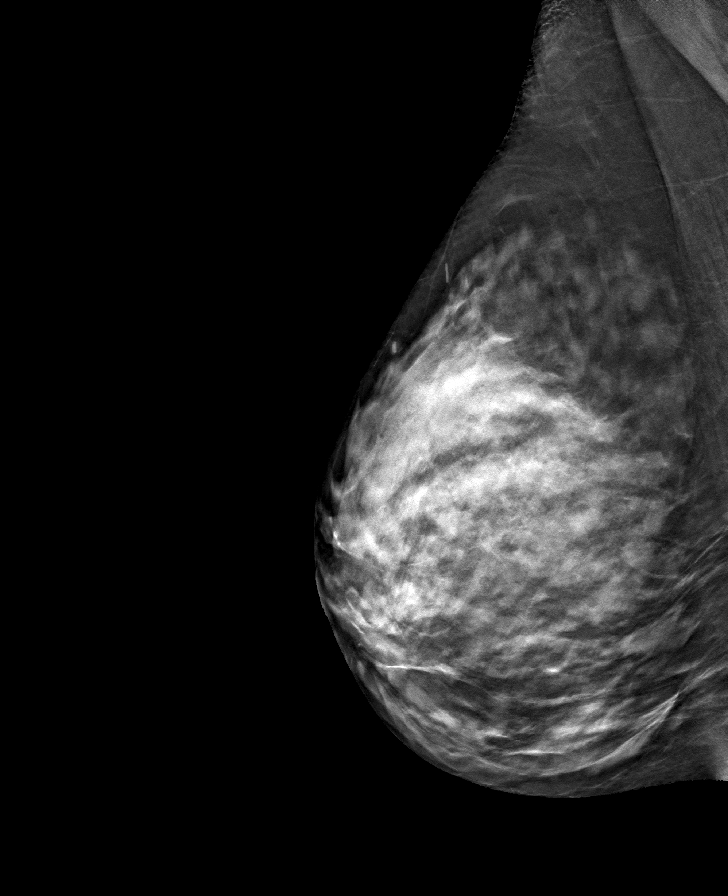

[8 of 24 positions shown; findings below may reference images not displayed]

ACR Breast Density Category c: The breast tissue is heterogeneously
dense, which may obscure small masses.
FINDINGS: There are no findings suspicious for malignancy.
IMPRESSION: No mammographic evidence of malignancy. A result letter of this
screening mammogram will be mailed directly to the patient.

RECOMMENDATION:
Screening mammogram in one year. (Code:Q3-W-BC3)

BI-RADS CATEGORY  1: Negative.

## 2024-11-09 ENCOUNTER — Encounter (HOSPITAL_COMMUNITY): Payer: Self-pay | Admitting: Surgery

## 2024-11-28 NOTE — Progress Notes (Signed)
 [facility]  DEPT. OF OTOLARYNGOLOGY ENT/HEAD AND NECK SURGERY  November 28, 2024   Chief Complaint:  Chief Complaint  Patient presents with   Follow-up   History of Present Illness: Candace Manning is a 78 y.o. female with a PMHx significant for dementia, who presents for evaluation of tube check.   She underwent bilateral myringotomy with tube placement on 10/23/23 with Dr. Woodward. She was last seen 04/21/24 at which time both tubes were in place and patent.  She returns today for 6 month tube check. She is accompanied by her friend and her husband who help provide the history. She has not had any pain or drainage from her ears. Her husband tells me she lost her right hearing aid a few months ago. She frequently takes her hearing aid out. She is still able to hear and communicate. Her husband wonders whether to get her a new hearing aid for the right ear.     History:  Past Medical History:  Past Medical History:  Diagnosis Date   Depression    Ovarian cancer    (CMD)     Past Surgical History:  Past Surgical History:  Procedure Laterality Date   MYRINGOTOMY W/ TUBES Bilateral 10/23/2023   MYRINGOTOMY WITH TUBES performed by Tilford Luann Woodward, MD at [facility]    Medications: She  has a current medication list which includes the following prescription(s): calcium  citrate malate-vit d3, clonazepam , cyanocobalamin , denosumab, donepezil , escitalopram , esomeprazole, fexofenadine, ipratropium, latanoprost , levocetirizine, memantine , methylprednisolone, multivitamin, olopatadine, pantoprazole , quetiapine , timolol, and triamcinolone acetonide.  Allergies:  Allergies  Allergen Reactions   Other Omega-3s Rash   Terbinafine Other (See Comments) and Rash    Allergic to the pill form of the medication    Family History: family history includes Depression in her father and paternal grandfather.  Social History: Alcohol  use:  reports that she does not  currently use alcohol .   Tobacco use:  reports that she has quit smoking. She has never been exposed to tobacco smoke. She has never used smokeless tobacco.   Review of Systems: Negative  for cough, dysphagia, lump in neck, swelling in legs, shortness of breath, unusual bleeding, joint pain, heat or cold intolerance, anxiety, depression, fever, visual change,chest pain, vomiting, blood in urine, skin rash, or seizures unless otherwise specified in the HPI.  Physical Examination: Vitals:   General Healthy, well appearing, in no acute distress.   Head [place]/AT. Facial tone and movement symmetric.  Eyes No scleral icterus or conjunctival hemorrhage. PERRL. EOMI.  Ears RIGHT: No external deformity. EAC clear of debris and erythema. Tympanostomy tube in place and patent. Middle ear aerated.   LEFT: No external deformity. EAC clear of debris and erythema. Tympanostomy tube in place and patent. Middle ear aerated.   Nose   Mouth/oral cavity No lip lesions.   Neck Supple, no thyromegaly or lymphadenopathy.  Cardiovascular No cyanosis.  Pulmonary Normal respiratory effort on room air. No accessory muscle usage.  Skin No lesions or scars of face or neck.  Neurologic CN II - XII grossly intact. Moving all extremities without gross abnormality.  Psychiatric Alert, somewhat confused and anxious.    06/23/23   Data Reviewed: I independently reviewed and interpreted past medical history, medications, allergies, pathology, laboratory data, and imaging from the patient's online medical record and referring physician, as well as the patient's intake form. These are described in the documentation where pertinent. I have discussed my assessment with the patient.  Assessment: Candace Manning  is a 78 y.o. female with:  1. Tympanostomy tube check      2. Sensorineural hearing loss (SNHL) of both ears      3. Wears hearing aid in left ear      4. 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   No orders of the defined types were placed in this encounter.  No orders of the defined types were placed in this encounter.  Total Time/Patient Education/Counseling: I have personally spent 20 minutes involved in face-to-face and non-face-to-face activities for this patient on the day of the visit. Professional time spent includes the following activities, in addition to those noted in the documentation: chart review, history and physical examination, patient education and counseling, and documentation. All questions were answered, and the patient is in agreement to the above plan.

## 2025-01-12 ENCOUNTER — Observation Stay (HOSPITAL_COMMUNITY)
Admission: EM | Admit: 2025-01-12 | Discharge: 2025-01-13 | Disposition: A | Discharge status: Skilled Nursing Facility | Source: Home / Self Care | Attending: Internal Medicine | Admitting: Internal Medicine

## 2025-01-12 ENCOUNTER — Emergency Department (HOSPITAL_COMMUNITY)

## 2025-01-12 ENCOUNTER — Other Ambulatory Visit: Payer: Self-pay

## 2025-01-12 DIAGNOSIS — I442 Atrioventricular block, complete: Secondary | ICD-10-CM | POA: Diagnosis present

## 2025-01-12 DIAGNOSIS — W19XXXA Unspecified fall, initial encounter: Principal | ICD-10-CM

## 2025-01-12 DIAGNOSIS — I459 Conduction disorder, unspecified: Secondary | ICD-10-CM | POA: Diagnosis present

## 2025-01-12 DIAGNOSIS — F411 Generalized anxiety disorder: Secondary | ICD-10-CM | POA: Diagnosis present

## 2025-01-12 DIAGNOSIS — I10 Essential (primary) hypertension: Secondary | ICD-10-CM | POA: Diagnosis present

## 2025-01-12 DIAGNOSIS — S0003XA Contusion of scalp, initial encounter: Secondary | ICD-10-CM

## 2025-01-12 DIAGNOSIS — K219 Gastro-esophageal reflux disease without esophagitis: Secondary | ICD-10-CM | POA: Diagnosis present

## 2025-01-12 DIAGNOSIS — E785 Hyperlipidemia, unspecified: Secondary | ICD-10-CM | POA: Diagnosis present

## 2025-01-12 LAB — CBC WITH DIFFERENTIAL/PLATELET
Band Neutrophils: 2 %
Basophils Absolute: 0 10*3/uL (ref 0.0–0.1)
Basophils Relative: 0 %
Eosinophils Absolute: 0.2 10*3/uL (ref 0.0–0.5)
Eosinophils Relative: 1 %
HCT: 47.1 % — ABNORMAL HIGH (ref 36.0–46.0)
Hemoglobin: 14.9 g/dL (ref 12.0–15.0)
Lymphocytes Relative: 34 %
Lymphs Abs: 5.9 10*3/uL — ABNORMAL HIGH (ref 0.7–4.0)
MCH: 31.1 pg (ref 26.0–34.0)
MCHC: 31.6 g/dL (ref 30.0–36.0)
MCV: 98.3 fL (ref 80.0–100.0)
Monocytes Absolute: 0.9 10*3/uL (ref 0.1–1.0)
Monocytes Relative: 5 %
Neutro Abs: 10.4 10*3/uL — ABNORMAL HIGH (ref 1.7–7.7)
Neutrophils Relative %: 58 %
Platelets: 269 10*3/uL (ref 150–400)
RBC: 4.79 MIL/uL (ref 3.87–5.11)
RDW: 12.5 % (ref 11.5–15.5)
Smear Review: NORMAL
WBC: 17.4 10*3/uL — ABNORMAL HIGH (ref 4.0–10.5)
nRBC: 0 % (ref 0.0–0.2)

## 2025-01-12 LAB — MAGNESIUM: Magnesium: 2.1 mg/dL (ref 1.7–2.4)

## 2025-01-12 LAB — BASIC METABOLIC PANEL WITH GFR
Anion gap: 18 — ABNORMAL HIGH (ref 5–15)
BUN: 10 mg/dL (ref 8–23)
CO2: 15 mmol/L — ABNORMAL LOW (ref 22–32)
Calcium: 9.3 mg/dL (ref 8.9–10.3)
Chloride: 105 mmol/L (ref 98–111)
Creatinine, Ser: 0.99 mg/dL (ref 0.44–1.00)
GFR, Estimated: 59 mL/min — ABNORMAL LOW
Glucose, Bld: 100 mg/dL — ABNORMAL HIGH (ref 70–99)
Potassium: 4.5 mmol/L (ref 3.5–5.1)
Sodium: 138 mmol/L (ref 135–145)

## 2025-01-12 LAB — I-STAT CHEM 8, ED
BUN: 10 mg/dL (ref 8–23)
Calcium, Ion: 1.07 mmol/L — ABNORMAL LOW (ref 1.15–1.40)
Chloride: 105 mmol/L (ref 98–111)
Creatinine, Ser: 1 mg/dL (ref 0.44–1.00)
Glucose, Bld: 101 mg/dL — ABNORMAL HIGH (ref 70–99)
HCT: 46 % (ref 36.0–46.0)
Hemoglobin: 15.6 g/dL — ABNORMAL HIGH (ref 12.0–15.0)
Potassium: 4.2 mmol/L (ref 3.5–5.1)
Sodium: 141 mmol/L (ref 135–145)
TCO2: 21 mmol/L — ABNORMAL LOW (ref 22–32)

## 2025-01-12 LAB — TSH: TSH: 1.32 u[IU]/mL (ref 0.350–4.500)

## 2025-01-12 LAB — TROPONIN T, HIGH SENSITIVITY
Troponin T High Sensitivity: 28 ng/L — ABNORMAL HIGH (ref 0–19)
Troponin T High Sensitivity: 26 ng/L — ABNORMAL HIGH (ref 0–19)

## 2025-01-12 MED ORDER — VITAMIN B-12 100 MCG PO TABS
100.0000 ug | ORAL_TABLET | Freq: Every day | ORAL | Status: DC
  Administered 2025-01-13: 100 ug via ORAL
  Filled 2025-01-12: qty 1

## 2025-01-12 MED ORDER — PANTOPRAZOLE SODIUM 40 MG PO TBEC
40.0000 mg | DELAYED_RELEASE_TABLET | Freq: Two times a day (BID) | ORAL | Status: DC
  Administered 2025-01-12: 40 mg via ORAL
  Filled 2025-01-12: qty 1

## 2025-01-12 MED ORDER — QUETIAPINE FUMARATE 25 MG PO TABS: 50.0000 mg | ORAL_TABLET | Freq: Every day | ORAL | Status: DC

## 2025-01-12 MED ORDER — HEPARIN SODIUM (PORCINE) 5000 UNIT/ML IJ SOLN: 5000.0000 [IU] | Freq: Three times a day (TID) | INTRAMUSCULAR | Status: DC

## 2025-01-12 MED ORDER — MIDAZOLAM HCL (PF) 2 MG/2ML IJ SOLN
2.0000 mg | Freq: Once | INTRAMUSCULAR | Status: AC
  Administered 2025-01-12: 2 mg via INTRAVENOUS
  Filled 2025-01-12: qty 2

## 2025-01-12 MED ORDER — POLYETHYLENE GLYCOL 3350 17 G PO PACK: 17.0000 g | PACK | Freq: Every day | ORAL | Status: DC | PRN

## 2025-01-12 MED ORDER — OYSTER SHELL CALCIUM/D3 500-5 MG-MCG PO TABS
0.5000 | ORAL_TABLET | Freq: Two times a day (BID) | ORAL | Status: DC
  Administered 2025-01-13: 0.5 via ORAL
  Filled 2025-01-12 (×3): qty 0.5

## 2025-01-12 MED ORDER — QUETIAPINE FUMARATE 25 MG PO TABS
75.0000 mg | ORAL_TABLET | Freq: Every day | ORAL | Status: DC
  Administered 2025-01-12: 75 mg via ORAL
  Filled 2025-01-12: qty 3

## 2025-01-12 MED ORDER — LORAZEPAM 2 MG/ML IJ SOLN
1.0000 mg | Freq: Once | INTRAMUSCULAR | Status: AC
  Administered 2025-01-12: 1 mg via INTRAVENOUS
  Filled 2025-01-12: qty 1

## 2025-01-12 MED ORDER — ALBUTEROL SULFATE (2.5 MG/3ML) 0.083% IN NEBU: 2.5000 mg | INHALATION_SOLUTION | RESPIRATORY_TRACT | Status: DC | PRN

## 2025-01-12 MED ORDER — CLONAZEPAM 0.5 MG PO TABS: 0.5000 mg | ORAL_TABLET | Freq: Two times a day (BID) | ORAL | Status: DC | PRN

## 2025-01-12 MED ORDER — LATANOPROST 0.005 % OP SOLN
1.0000 [drp] | Freq: Every day | OPHTHALMIC | Status: DC
  Filled 2025-01-12: qty 2.5

## 2025-01-12 MED ORDER — ADULT MULTIVITAMIN LIQUID CH
15.0000 mL | Freq: Every day | ORAL | Status: DC
  Administered 2025-01-13: 15 mL via ORAL
  Filled 2025-01-12: qty 15

## 2025-01-12 MED ORDER — LORAZEPAM 2 MG/ML IJ SOLN
1.0000 mg | INTRAMUSCULAR | Status: DC | PRN
  Administered 2025-01-12: 1 mg via INTRAVENOUS
  Filled 2025-01-12: qty 1

## 2025-01-12 MED ORDER — LACTATED RINGERS IV SOLN: INTRAVENOUS | Status: DC

## 2025-01-12 MED ORDER — ACETAMINOPHEN 325 MG PO TABS: 650.0000 mg | ORAL_TABLET | Freq: Four times a day (QID) | ORAL | Status: DC | PRN

## 2025-01-12 MED ORDER — MEMANTINE HCL 10 MG PO TABS
10.0000 mg | ORAL_TABLET | Freq: Two times a day (BID) | ORAL | Status: DC
  Administered 2025-01-13: 10 mg via ORAL
  Filled 2025-01-12: qty 1

## 2025-01-12 MED ORDER — ADULT MULTIVITAMIN LIQUID CH
15.0000 mL | Freq: Every day | ORAL | Status: DC
  Filled 2025-01-12: qty 15

## 2025-01-12 MED ORDER — ACETAMINOPHEN 650 MG RE SUPP: 650.0000 mg | Freq: Four times a day (QID) | RECTAL | Status: DC | PRN

## 2025-01-12 MED ORDER — ATROPINE SULFATE 1 MG/10ML IJ SOSY: 1.0000 mg | PREFILLED_SYRINGE | INTRAMUSCULAR | Status: DC | PRN

## 2025-01-12 NOTE — Evaluation (Signed)
 RT Evaluate and Treat Note  01/12/2025   Breathing is (select one): Same as normal    The following was found on auscultation (select multiple):  Bilateral Breath Sounds: Clear (01/12/25 1737)             Cough Assessment:      Most Recent Chest Xray:... (No results found.    The following medications and/or interventions were ordered/changed/discontinued as part of the Respiratory Treatment protocol:   Medication Changes: No Change   Airway Clearance Changes: No Change   Oxygen Therapy Changes:No Change

## 2025-01-12 NOTE — ED Provider Notes (Signed)
 " [place] EMERGENCY DEPARTMENT AT [facility] Provider Note   CSN: [id number] Arrival date & time: 01/12/25  1337     Patient presents with: Loss of Consciousness   Candace Manning is a 79 y.o. female.   HPI 79 year old female history of dementia, presents today from dementia care unit with report of fall.  Patient had unwitnessed fall.  Per EMS she may have had a syncopal episode and they noted bradycardia.  Patient denied any pain to them.  This was reported as the patient is DNR.  Husband is now at bedside and confirms Patient denies any pain or injury to me.    Prior to Admission medications  Medication Sig Start Date End Date Taking? Authorizing Provider  busPIRone  (BUSPAR ) 15 MG tablet 2   qam   1  At diiner Patient not taking: Reported on 05/13/2023 05/13/17   Tasia Lung, MD  calcium -vitamin D 250-100 MG-UNIT tablet Take 1 tablet by mouth 2 (two) times daily.    [provider]  Cholecalciferol  (VITAMIN D PO) Take 1 tablet by mouth daily.    [provider]  clonazePAM  (KLONOPIN ) 0.5 MG tablet Take 1 tablet (0.5 mg total) by mouth 2 (two) times daily. Patient taking differently: Take 0.5 mg by mouth daily as needed for anxiety. 12/04/16 05/13/23  Plovsky, Lung, MD  donepezil  (ARICEPT ) 10 MG tablet Take 10 mg by mouth at bedtime.    [provider]  EPINEPHrine  (EPIPEN  2-PAK) 0.3 mg/0.3 mL IJ SOAJ injection Inject 0.3 mg into the muscle daily as needed for anaphylaxis. 12/12/14   [provider]  escitalopram  (LEXAPRO ) 20 MG tablet Take 20 mg by mouth daily.    [provider]  fexofenadine (ALLEGRA) 180 MG tablet Take 180 mg by mouth daily.    [provider]  HYDROcodone -acetaminophen  (NORCO/VICODIN) 5-325 MG tablet Take 1 tablet by mouth every 6 (six) hours as needed for severe pain. 05/15/23   Maczis, Michael M, PA-C  ipratropium (ATROVENT) 0.06 % nasal spray Place 2 sprays into both nostrils daily as needed for  rhinitis.    [provider]  latanoprost  (XALATAN ) 0.005 % ophthalmic solution Place 1 drop into both eyes at bedtime.    [provider]  levocetirizine (XYZAL) 5 MG tablet Take 5 mg by mouth every evening. 05/12/15   [provider]  memantine  (NAMENDA ) 10 MG tablet Take 10 mg by mouth 2 (two) times daily.    [provider]  mirtazapine  (REMERON ) 30 MG tablet Take 1 tablet (30 mg total) by mouth at bedtime. 12/04/16 12/04/17  Plovsky, Lung, MD  Multiple Vitamins-Minerals (MULTIVITAMIN PO) Take 1 tablet by mouth daily.    [provider]  pantoprazole  (PROTONIX ) 40 MG tablet Take 1 tablet (40 mg total) by mouth 2 (two) times daily. 05/15/23   Maczis, Michael M, PA-C  QUEtiapine  (SEROQUEL ) 25 MG tablet Take 25 mg by mouth at bedtime.    [provider]  QUEtiapine  (SEROQUEL ) 50 MG tablet Take 50 mg by mouth at bedtime.    [provider]  sertraline  (ZOLOFT ) 50 MG tablet Take 1 tablet (50 mg total) by mouth daily. Patient not taking: Reported on 05/13/2023 05/13/17   Tasia Lung, MD  vitamin B-12 (CYANOCOBALAMIN ) 100 MCG tablet Take 100 mcg by mouth daily.    [provider]    Allergies: Lamisil [terbinafine]    Review of Systems  Updated Vital Signs BP (!) 186/75 (BP Location: Right Arm)   Pulse  63   Resp 11   Ht 1.651 m (5' 5)   Wt 58 kg   LMP 02/05/2009   SpO2 98%   BMI 21.28 kg/m   Physical Exam Vitals reviewed.  Constitutional:      Appearance: Normal appearance.  HENT:     Head: Normocephalic.     Comments: Swelling noted to back of head    Right Ear: External ear normal.     Left Ear: External ear normal.     Nose: Nose normal.     Mouth/Throat:     Pharynx: Oropharynx is clear.  Eyes:     Pupils: Pupils are equal, round, and reactive to light.  Cardiovascular:     Rate and Rhythm: Normal rate and regular rhythm.     Pulses: Normal pulses.  Pulmonary:     Effort: Pulmonary effort is normal.      Breath sounds: Normal breath sounds.  Abdominal:     General: Abdomen is flat.     Palpations: Abdomen is soft.  Musculoskeletal:        General: Normal range of motion.     Cervical back: Normal range of motion.  Skin:    General: Skin is warm and dry.     Capillary Refill: Capillary refill takes less than 2 seconds.  Neurological:     General: No focal deficit present.     Mental Status: She is alert.  Psychiatric:        Mood and Affect: Mood normal.     (all labs ordered are listed, but only abnormal results are displayed) Labs Reviewed  I-STAT CHEM 8, ED - Abnormal; Notable for the following components:      Result Value   Glucose, Bld 101 (*)    Calcium , Ion 1.07 (*)    TCO2 21 (*)    Hemoglobin 15.6 (*)    All other components within normal limits  TSH  TROPONIN T, HIGH SENSITIVITY  TROPONIN T, HIGH SENSITIVITY    EKG: EKG Interpretation Date/Time:  Thursday January 12 2025 13:43:28 EST Ventricular Rate:  68 PR Interval:  194 QRS Duration:  145 QT Interval:  595 QTC Calculation: 600 R Axis:   122  Text Interpretation: Third degree heart block Multiform ventricular premature complexes Consider left atrial enlargement Nonspecific intraventricular conduction delay Probable anteroseptal infarct, recent Abnormal T, consider ischemia, diffuse leads Lateral leads are also involved Confirmed by Levander Houston [phone]) on 01/12/2025 2:26:40 PM  Radiology: CT Cervical Spine Wo Contrast Result Date: 01/12/2025 EXAM: CT CERVICAL SPINE WITHOUT CONTRAST 01/12/2025 03:10:00 PM TECHNIQUE: CT of the cervical spine was performed without the administration of intravenous contrast. Multiplanar reformatted images are provided for review. Automated exposure control, iterative reconstruction, and/or weight based adjustment of the mA/kV was utilized to reduce the radiation dose to as low as reasonably achievable. COMPARISON: MRI of cervical spine 01/30/2006. CLINICAL HISTORY: Neck  trauma (Age >= 65y). FINDINGS: BONES AND ALIGNMENT: There is mild kyphosis of the cervical spine centered at C4-C5 without evidence of acute or traumatic vertebral column listhesis. No acute fracture or traumatic malalignment of the cervical spine. DEGENERATIVE CHANGES: There are multilevel degenerative changes. SOFT TISSUES: No prevertebral soft tissue swelling. IMPRESSION: 1. No acute fracture or traumatic malalignment of the cervical spine. Electronically signed by: Prentice Spade MD 01/12/2025 03:40 PM EST RP Workstation: [id number]   CT Head Wo Contrast Result Date: 01/12/2025 EXAM: CT HEAD WITHOUT CONTRAST 01/12/2025 03:10:00 PM TECHNIQUE: CT of the head was performed without the  administration of intravenous contrast. Automated exposure control, iterative reconstruction, and/or weight based adjustment of the mA/kV was utilized to reduce the radiation dose to as low as reasonably achievable. COMPARISON: MRI brain 04/11/2017. CLINICAL HISTORY: Head trauma, minor (Age >= 65y). Head trauma, minor (Age >= 65y). FINDINGS: BRAIN AND VENTRICLES: No acute hemorrhage. No evidence of acute infarct. No hydrocephalus. No extra-axial collection. No mass effect or midline shift. There is parenchymal volume loss. Chronic right cerebellar infarction. There are moderate scattered white matter hypodensities which are nonspecific but most commonly represent chronic microvascular ischemic changes. ORBITS: No acute abnormality. SINUSES: Chronic bilateral maxillary sinusitis. SOFT TISSUES AND SKULL: Posterior scalp swelling. No calvarial fracture. IMPRESSION: 1. No acute intracranial abnormality. 2. No calvarial fracture. 3. Posterior scalp swelling. Electronically signed by: Prentice Spade MD 01/12/2025 03:27 PM EST RP Workstation: [id number]     .Critical Care  Performed by: Levander Houston, MD Authorized by: Levander Houston, MD   Critical care provider statement:    Critical care time (minutes):  60   Critical care time  was exclusive of:  Separately billable procedures and treating other patients and teaching time   Critical care was necessary to treat or prevent imminent or life-threatening deterioration of the following conditions:  Trauma and cardiac failure   Critical care was time spent personally by me on the following activities:  Development of treatment plan with patient or surrogate, discussions with consultants, evaluation of patient's response to treatment, examination of patient, ordering and review of laboratory studies, ordering and review of radiographic studies, ordering and performing treatments and interventions, pulse oximetry, re-evaluation of patient's condition and review of old charts    Medications Ordered in the ED  midazolam  PF (VERSED ) injection 2 mg (2 mg Intravenous Given 01/12/25 1502)    Clinical Course as of 01/12/25 1546  Thu Jan 12, 2025  1542 CT cervical spine without acute fracture or dislocation noted [DR]  1543 Hemoglobin slightly elevated at 15 electrolytes within normal limits, glucose within normal limits [DR]    Clinical Course User Index [DR] Levander Houston, MD                                 Medical Decision Making Amount and/or Complexity of Data Reviewed Radiology: ordered.  Risk Prescription drug management.   Patient with dementia who reports after fall  syncope at memory care unit. Patient evaluated here with labs, imaging, EKG. EKG concerning for third-degree block.  Discussed with cardiology and concur with concerns and advised that EP will see patient.  Injuries from fall include contusion to back of head.  Imaging obtained.  CT head results pending at this time Patient has DNR with her.  Husband currently states that he wishes to have everything done.   1-mechanical fall vs syncope 2- head contusion from fall- ct head no acute ich 3- neck ct negative 4- bradycardia with 3rd degree block on EKG. patient is on monitor with pads in place.  She  currently is hemodynamically stable with heart rate 60 and blood pressure 186/75.  Care was discussed with cardiology and EP is to see  Care signed out to Dr. Ruthe    Final diagnoses:  Fall, initial encounter  Third degree heart block (HCC)  Contusion of scalp, initial encounter    ED Discharge Orders     None          Levander Houston, MD 01/12/25 1546  "

## 2025-01-12 NOTE — ED Triage Notes (Signed)
 Pt to the ed [facility] via ems with a CC of syncope with onset bradycardia. Pt denies current pain. Pt is in the memory care facility. Per staff pt is at her baseline mentation. Pt relays dizziness denies cp

## 2025-01-12 NOTE — H&P (Signed)
 "                                                                                                          [facility] H&P   Patient Demographics:    Candace Manning, is a 79 y.o. female  MRN: [id number]   DOB - 02/07/1946  Admit Date - 01/12/2025  Outpatient Primary MD for the patient is Aisha Harvey, MD    Chief Complaint  Patient presents with   Loss of Consciousness      HPI:    Candace Manning  is a 79 y.o. female, past medical history of advanced Alzheimer dementia, anxiety, hearing loss, urinary incontinence, GERD, patient lives at [facility] with memory care unit for last year and a half, she is with advanced dementia, but ambulatory with no assistance. - She is poor historian, unable to provide history, sent was obtained from husband, and ED staff, apparently she was found on the floor with unwitnessed fall, possible syncope, her EKG in ED showing complete heart block, with escape rate in the 40s, husband reports she was not prescribed any new medicine, all her medicine ongoing for couple years, she is on Aricept  with he reports she has been on it for few years already, initial labs with troponins of 28, hemoglobin of 15.6, TSH within normal limits at 1.32, EP was consulted by cardiology, and [facility] hospitalist consulted to admit     Review of systems:    Advanced dementia, can't provide any reliable history.   With Past History of the following :    Past Medical History:  Diagnosis Date   Bursitis    Colitis    microscopic, Dr. Vicci   Depression    Elevated cholesterol    GERD (gastroesophageal reflux disease)    Headache(784.0)    migraines with dizziness   Heart murmur    Hiatal hernia    Dr. Kristie   Mitral valve prolapse    Osteopenia    Ovarian cancer (HCC) 6/15   neg genetic testing   Shingles 03/2010      Past Surgical History:  Procedure Laterality Date   ABDOMINAL HYSTERECTOMY     BREAST BIOPSY  09/1999   left breast-benign   BREAST EXCISIONAL BIOPSY Left     HIATAL HERNIA REPAIR N/A 05/14/2023   Procedure: LAPAROSCOPIC HIATAL HERNIA REPAIR WITH MESH;  Surgeon: Lyndel Deward PARAS, MD;  Location: [facility] OR;  Service: General;  Laterality: N/A;   LAPAROSCOPIC GASTROSTOMY N/A 05/14/2023   Procedure: LAPAROSCOPIC GASTROPEXY;  Surgeon: Lyndel Deward PARAS, MD;  Location: [facility] OR;  Service: General;  Laterality: N/A;   NASAL SEPTUM SURGERY  1979   TONSILLECTOMY  1954      Social History:     Social History   Tobacco Use   Smoking status: Never   Smokeless tobacco: Never   Tobacco comments:    smoked socially in college--very occ  Substance Use Topics   Alcohol  use: No    Comment: wine       Family History :  Family History  Problem Relation Age of Onset   Ovarian cancer Mother 10   Breast cancer Maternal Grandmother        dx. 40 or younger   Colon cancer Maternal Aunt        dx. 60s   Diabetes Maternal Grandfather    Stroke Maternal Grandfather        +EtOH   Breast cancer Paternal Aunt        dx. 50 or younger   Hypertension Father    Stroke Father    Other Sister        hx of TAH-BSO   Colon cancer Maternal Uncle        dx. >50   Stroke Maternal Uncle    Depression Paternal Uncle    Stroke Paternal Grandmother    Depression Paternal Grandfather    Breast cancer Maternal Aunt        dx. <50   Breast cancer Maternal Aunt        dx. >50   Lung cancer Cousin        dx. <50; smoker   Stroke Paternal Aunt       Home Medications:   Prior to Admission medications  Medication Sig Start Date End Date Taking? Authorizing Provider  busPIRone  (BUSPAR ) 15 MG tablet 2   qam   1  At diiner Patient not taking: Reported on 05/13/2023 05/13/17   Tasia Lung, MD  calcium -vitamin D 250-100 MG-UNIT tablet Take 1 tablet by mouth 2 (two) times daily.    [provider]  Cholecalciferol  (VITAMIN D PO) Take 1 tablet by mouth daily.    [provider]  clonazePAM  (KLONOPIN ) 0.5 MG tablet Take 1 tablet (0.5 mg total)  by mouth 2 (two) times daily. Patient taking differently: Take 0.5 mg by mouth daily as needed for anxiety. 12/04/16 05/13/23  Plovsky, Lung, MD  donepezil  (ARICEPT ) 10 MG tablet Take 10 mg by mouth at bedtime.    [provider]  EPINEPHrine  (EPIPEN  2-PAK) 0.3 mg/0.3 mL IJ SOAJ injection Inject 0.3 mg into the muscle daily as needed for anaphylaxis. 12/12/14   [provider]  escitalopram  (LEXAPRO ) 20 MG tablet Take 20 mg by mouth daily.    [provider]  fexofenadine (ALLEGRA) 180 MG tablet Take 180 mg by mouth daily.    [provider]  HYDROcodone -acetaminophen  (NORCO/VICODIN) 5-325 MG tablet Take 1 tablet by mouth every 6 (six) hours as needed for severe pain. 05/15/23   Maczis, Michael M, PA-C  ipratropium (ATROVENT) 0.06 % nasal spray Place 2 sprays into both nostrils daily as needed for rhinitis.    [provider]  latanoprost  (XALATAN ) 0.005 % ophthalmic solution Place 1 drop into both eyes at bedtime.    [provider]  levocetirizine (XYZAL) 5 MG tablet Take 5 mg by mouth every evening. 05/12/15   [provider]  memantine  (NAMENDA ) 10 MG tablet Take 10 mg by mouth 2 (two) times daily.    [provider]  mirtazapine  (REMERON ) 30 MG tablet Take 1 tablet (30 mg total) by mouth at bedtime. 12/04/16 12/04/17  Plovsky, Lung, MD  Multiple Vitamins-Minerals (MULTIVITAMIN PO) Take 1 tablet by mouth daily.    [provider]  pantoprazole  (PROTONIX ) 40 MG tablet Take 1 tablet (40 mg total) by mouth 2 (two) times daily. 05/15/23   Maczis, Michael M, PA-C  QUEtiapine  (SEROQUEL ) 25 MG tablet Take 25 mg by mouth at bedtime.    [provider]  QUEtiapine  (SEROQUEL ) 50 MG tablet Take 50 mg by mouth at bedtime.    [provider]  sertraline  (ZOLOFT ) 50 MG tablet Take 1 tablet (50 mg total) by mouth daily. Patient not taking: Reported on 05/13/2023 05/13/17   Tasia Lung, MD  vitamin B-12  (CYANOCOBALAMIN ) 100 MCG tablet Take 100 mcg by mouth daily.    [provider]     Allergies:    Allergies[1]   Physical Exam:   Vitals  Blood pressure (!) 186/75, pulse 63, resp. rate 11, height 5' 5 (1.651 m), weight 58 kg, last menstrual period 02/05/2009, SpO2 98%.   1. General Frail elderly female, laying in bed, no apparent distress  2.  Advanced dementia, impaired judgment and insight, does not follow any commands or answer any questions   3. No F.N deficits, ALL C.Nerves Intact, motor grossly intact  4. Ears and Eyes appear Normal, Conjunctivae clear, PERRLA. Moist Oral Mucosa.  5. Supple Neck, No JVD, No cervical lymphadenopathy appriciated, No Carotid Bruits.  6. Symmetrical Chest wall movement, Good air movement bilaterally, CTAB.  7.  Bradycardic, No Gallops, Rubs or Murmurs, No Parasternal Heave.  8. Positive Bowel Sounds, Abdomen Soft, No tenderness, No organomegaly appriciated,No rebound -guarding or rigidity.  9.  No Cyanosis, Normal Skin Turgor, No Skin Rash or Bruise.  10. Good muscle tone,  joints appear normal , no effusions, Normal ROM.     Data Review:    CBC Recent Labs  Lab 01/12/25 1516  HGB 15.6*  HCT 46.0   ------------------------------------------------------------------------------------------------------------------  Chemistries  Recent Labs  Lab 01/12/25 1516  NA 141  K 4.2  CL 105  GLUCOSE 101*  BUN 10  CREATININE 1.00   ------------------------------------------------------------------------------------------------------------------ estimated creatinine clearance is 43.1 mL/min (by C-G formula based on SCr of 1 mg/dL). ------------------------------------------------------------------------------------------------------------------ Recent Labs    01/12/25 1502  TSH 1.320    Coagulation profile No results for input(s): INR, PROTIME in the last 168  hours. ------------------------------------------------------------------------------------------------------------------- No results for input(s): DDIMER in the last 72 hours. -------------------------------------------------------------------------------------------------------------------  Cardiac Enzymes No results for input(s): CKMB, TROPONINI, MYOGLOBIN in the last 168 hours.  Invalid input(s): CK ------------------------------------------------------------------------------------------------------------------ No results found for: BNP   ---------------------------------------------------------------------------------------------------------------  Urinalysis    Component Value Date/Time   BILIRUBINUR n 06/01/2015 1349   PROTEINUR n 06/01/2015 1349   UROBILINOGEN negative 06/01/2015 1349   NITRITE n 06/01/2015 1349   LEUKOCYTESUR Negative 06/01/2015 1349    ----------------------------------------------------------------------------------------------------------------   Imaging Results:    CT Cervical Spine Wo Contrast Result Date: 01/12/2025 EXAM: CT CERVICAL SPINE WITHOUT CONTRAST 01/12/2025 03:10:00 PM TECHNIQUE: CT of the cervical spine was performed without the administration of intravenous contrast. Multiplanar reformatted images are provided for review. Automated exposure control, iterative reconstruction, and/or weight based adjustment of the mA/kV was utilized to reduce the radiation dose to as low as reasonably achievable. COMPARISON: MRI of cervical spine 01/30/2006. CLINICAL HISTORY: Neck trauma (Age >= 65y). FINDINGS: BONES AND ALIGNMENT: There is mild kyphosis of the cervical spine centered at C4-C5 without evidence of acute or traumatic vertebral column listhesis. No acute fracture or traumatic malalignment of the cervical spine. DEGENERATIVE CHANGES: There are multilevel degenerative changes. SOFT TISSUES: No prevertebral soft tissue swelling.  IMPRESSION: 1. No acute fracture or traumatic malalignment of the cervical spine. Electronically signed by: Prentice Spade MD 01/12/2025 03:40 PM EST RP Workstation: [id number]   CT Head Wo Contrast Result Date: 01/12/2025 EXAM: CT HEAD WITHOUT CONTRAST 01/12/2025 03:10:00 PM TECHNIQUE: CT of the head was  performed without the administration of intravenous contrast. Automated exposure control, iterative reconstruction, and/or weight based adjustment of the mA/kV was utilized to reduce the radiation dose to as low as reasonably achievable. COMPARISON: MRI brain 04/11/2017. CLINICAL HISTORY: Head trauma, minor (Age >= 65y). Head trauma, minor (Age >= 65y). FINDINGS: BRAIN AND VENTRICLES: No acute hemorrhage. No evidence of acute infarct. No hydrocephalus. No extra-axial collection. No mass effect or midline shift. There is parenchymal volume loss. Chronic right cerebellar infarction. There are moderate scattered white matter hypodensities which are nonspecific but most commonly represent chronic microvascular ischemic changes. ORBITS: No acute abnormality. SINUSES: Chronic bilateral maxillary sinusitis. SOFT TISSUES AND SKULL: Posterior scalp swelling. No calvarial fracture. IMPRESSION: 1. No acute intracranial abnormality. 2. No calvarial fracture. 3. Posterior scalp swelling. Electronically signed by: Prentice Spade MD 01/12/2025 03:27 PM EST RP Workstation: [id number]    My personal review of EKG: Initial EKG with complete heart block heart rate at 68, repeat EKG with heart block at 40 bpm, initial QTc 600, repeat is 415   Assessment & Plan:    Principal Problem:   Heart block Active Problems:   GERD (gastroesophageal reflux disease)   Essential hypertension   GAD (generalized anxiety disorder)   Hyperlipidemia  Syncope High-grade AV block Intermittent complete heart block -Patient presents with fall, appears to be syncope, too high grade AV block. - Continue to hold Aricept  - Continue  telemetry monitoring. - Potassium within normal limit, TSH within normal limit - Check magnesium level - Keep pacer at bedside. - Keep atropine  at bedside. - Management per EP. - CT head and cervical spine with no acute findings   Dementia -appears to be on Namenda  and Aricept  - Will hold Aricept  as it sometimes causes bradycardia and heart block - Continue with Namenda  and Seroquel   Prolonged QTc - Initial QTc was 600, but this might be artificially low due to complete heart block with junctional rhyth - Electrolyte closely and replete as needed  - Still awaiting final medication reconciliation, but for now we will hold her sertraline , mirtazapine  given prolonged QTc, but will continue with Seroquel  given advanced dementia and high risk for delirium  Hypertension  - Per history, does not appear to be on any medications at home, continue to monitor will add as needed hydralazine . - Pressure is elevated, most recently 175/84, but this is most likely in the setting of restlessness and agitation requiring Ativan  to calm her down.  GAD  - Continue with Klonopin  as needed  - Will add as needed Ativan  as well as with advanced dementia high risk for hospital delirium. - Continue with Seroquel      DVT Prophylaxis Heparin    AM Labs Ordered, also please review Full Orders  Family Communication: Admission, patients condition and plan of care including tests being ordered have been discussed with the patient and who indicate understanding and agree with the plan and Code Status.  Code Status DNR  Likely DC to  back to memory care at [facility]  Consults called: EP cardiology    Admission status: inpatient    Time spent in minutes : 70 minutes   Brayton Lye M.D on 01/12/2025 at 4:54 PM   [facility] - Office  [phone]        [1]  Allergies Allergen Reactions   Lamisil [Terbinafine] Rash   "

## 2025-01-12 NOTE — Consult Note (Addendum)
 "   ELECTROPHYSIOLOGY CONSULT NOTE    Patient ID: Candace Manning MRN: [id number], DOB/AGE: 77/20/1947 77 y.o.  Admit date: 01/12/2025 Date of Consult: 01/12/2025  Primary Physician: Aisha Harvey, MD Primary Cardiologist: None  Electrophysiologist: Dr. Inocencio   Referring Provider: Dr. Levander  Patient Profile: Candace Manning is a 79 y.o. female with a history of Alzheimer's dementia, anxiety, hearing loss, urinary incontinence who is being seen today for the evaluation of complete heart block at the request of Dr. Levander.  HPI:   Resides at [facility] and was brought to the ED by EMS for evaluation after apparent unwitnessed fall, possible syncope. ECG in the ED shows complete heart block with escape rate in the 40s.   Patient appears to be or have been followed by Dr. Lilton with [facility]. Per notes from last July, patient's dementia was classified as FAST stage 6b-6c with some behavioral challenges including aggression noted.   HPI severely limited by patient's dementia. Appears to have been an unwitnessed fall with cause unknown. Patient's spouse at bedside does not currently have additional details. Not clear at this time if patient had true syncope.  Labs Potassium4.2 (02/05 1516) Magnesium  2.1 (02/05 1502) Creatinine, ser  1.00 (02/05 1516) PLT  269 (02/05 1502) HGB  15.6* (02/05 1516) WBC 17.4* (02/05 1502)  .    Past Medical History:  Diagnosis Date   Bursitis    Colitis    microscopic, Dr. Vicci   Depression    Elevated cholesterol    GERD (gastroesophageal reflux disease)    Headache(784.0)    migraines with dizziness   Heart murmur    Hiatal hernia    Dr. Kristie   Mitral valve prolapse    Osteopenia    Ovarian cancer (HCC) 6/15   neg genetic testing   Shingles 03/2010     Surgical History:  Past Surgical History:  Procedure Laterality Date   ABDOMINAL HYSTERECTOMY     BREAST BIOPSY  09/1999   left breast-benign    BREAST EXCISIONAL BIOPSY Left    HIATAL HERNIA REPAIR N/A 05/14/2023   Procedure: LAPAROSCOPIC HIATAL HERNIA REPAIR WITH MESH;  Surgeon: Lyndel Deward PARAS, MD;  Location: [facility] OR;  Service: General;  Laterality: N/A;   LAPAROSCOPIC GASTROSTOMY N/A 05/14/2023   Procedure: LAPAROSCOPIC GASTROPEXY;  Surgeon: Lyndel Deward PARAS, MD;  Location: [facility] OR;  Service: General;  Laterality: N/A;   NASAL SEPTUM SURGERY  1979   TONSILLECTOMY  1954     (Not in a hospital admission)   Inpatient Medications:   calcium -vitamin D  1 tablet Oral BID   [START ON 01/13/2025] heparin   5,000 Units Subcutaneous Q8H   latanoprost   1 drop Both Eyes QHS   [START ON 01/13/2025] memantine   10 mg Oral BID   Multivitamin   Oral Daily   pantoprazole   40 mg Oral BID   QUEtiapine   25 mg Oral QHS   QUEtiapine   50 mg Oral QHS   vitamin B-12  100 mcg Oral Daily    Allergies: Allergies[1]  Family History  Problem Relation Age of Onset   Ovarian cancer Mother 25   Breast cancer Maternal Grandmother        dx. 43 or younger   Colon cancer Maternal Aunt        dx. 60s   Diabetes Maternal Grandfather    Stroke Maternal Grandfather        +EtOH   Breast cancer Paternal Aunt  dx. 69 or younger   Hypertension Father    Stroke Father    Other Sister        hx of TAH-BSO   Colon cancer Maternal Uncle        dx. >50   Stroke Maternal Uncle    Depression Paternal Uncle    Stroke Paternal Grandmother    Depression Paternal Grandfather    Breast cancer Maternal Aunt        dx. <50   Breast cancer Maternal Aunt        dx. >50   Lung cancer Cousin        dx. <50; smoker   Stroke Paternal Aunt      Physical Exam: Vitals:   01/12/25 1400 01/12/25 1445 01/12/25 1515 01/12/25 1700  BP: (!) 179/108 (!) 195/73 (!) 186/75 (!) 175/84  Pulse: 73  63 (!) 40  Resp: 14 15 11 13   SpO2: 97%  98% 98%  Weight:      Height:       **physical exam limited by patient aggression/agitation GEN- Agitated, confused, not  oriented   HEENT: in C-collar. Posterior head swelling Lungs- normal work of breathing Heart- unable to assess GI- unable to assess Extremities- No visible clubbing, cyanosis, or edema   Radiology/Studies: CT Cervical Spine Wo Contrast Result Date: 01/12/2025 EXAM: CT CERVICAL SPINE WITHOUT CONTRAST 01/12/2025 03:10:00 PM TECHNIQUE: CT of the cervical spine was performed without the administration of intravenous contrast. Multiplanar reformatted images are provided for review. Automated exposure control, iterative reconstruction, and/or weight based adjustment of the mA/kV was utilized to reduce the radiation dose to as low as reasonably achievable. COMPARISON: MRI of cervical spine 01/30/2006. CLINICAL HISTORY: Neck trauma (Age >= 65y). FINDINGS: BONES AND ALIGNMENT: There is mild kyphosis of the cervical spine centered at C4-C5 without evidence of acute or traumatic vertebral column listhesis. No acute fracture or traumatic malalignment of the cervical spine. DEGENERATIVE CHANGES: There are multilevel degenerative changes. SOFT TISSUES: No prevertebral soft tissue swelling. IMPRESSION: 1. No acute fracture or traumatic malalignment of the cervical spine. Electronically signed by: Prentice Spade MD 01/12/2025 03:40 PM EST RP Workstation: [id number]   CT Head Wo Contrast Result Date: 01/12/2025 EXAM: CT HEAD WITHOUT CONTRAST 01/12/2025 03:10:00 PM TECHNIQUE: CT of the head was performed without the administration of intravenous contrast. Automated exposure control, iterative reconstruction, and/or weight based adjustment of the mA/kV was utilized to reduce the radiation dose to as low as reasonably achievable. COMPARISON: MRI brain 04/11/2017. CLINICAL HISTORY: Head trauma, minor (Age >= 65y). Head trauma, minor (Age >= 65y). FINDINGS: BRAIN AND VENTRICLES: No acute hemorrhage. No evidence of acute infarct. No hydrocephalus. No extra-axial collection. No mass effect or midline shift. There is parenchymal  volume loss. Chronic right cerebellar infarction. There are moderate scattered white matter hypodensities which are nonspecific but most commonly represent chronic microvascular ischemic changes. ORBITS: No acute abnormality. SINUSES: Chronic bilateral maxillary sinusitis. SOFT TISSUES AND SKULL: Posterior scalp swelling. No calvarial fracture. IMPRESSION: 1. No acute intracranial abnormality. 2. No calvarial fracture. 3. Posterior scalp swelling. Electronically signed by: Prentice Spade MD 01/12/2025 03:27 PM EST RP Workstation: [id number]    EKG: Complete heart block with escape morphology consistent with LBBB. Ventricular rate ~40bpm. 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 In the ED, found with high grade AVB. Patient's medications reviewed, none likely to contribute to her AVB. Given advanced dementia, do not feel that patient would be a candidate for transvenous pacemaker, would have high risk of post-implant complications. Leadless pacemaker would be safer alternative. Discussed this as a potential strategy with patient's spouse. He wishes to discuss general goals of care with his children before making a decision.   Dementia Management per primary team.  Dr. Kennyth had long discussion with multiple family members.  PPM decision pending course and goals of care conversations.    For questions or updates, please contact [facility] Please consult [email or web address] for contact info under     Signed, Ozell Prentice Passey, PA-C  01/12/2025, 5:46 PM    I have seen, examined the patient, and reviewed the above assessment and plan.    HPI: Candace Manning is a 79 y.o. female with a history of  Alzheimer's dementia, anxiety, hearing loss, urinary incontinence who was brought in from her memory care facility after having a fall.  Patient is pleasantly demented and unable to provide additional history.  History was gathered from her husband.  She lives in a memory care facility where she spends most of her day in bed but occasionally will get up and roam the halls.  She had a fall a few months ago and then again today.  Upon arrival to ED was found to be in intermittent complete heart block and EP was consulted.  General: Well developed, in no acute distress.  Neck: No JVD.  Cardiac: Bradycardic, irregular rhythm.  Resp: Normal work of breathing.  Ext: No edema.  Neuro: Confused.  Assessment and Plan:  #Intermittent complete heart block #Advanced dementia - Difficult situation was discussed in detail with the patient's husband and multiple family members.  She has advanced dementia and lives in a memory care facility where she spends most of her time in bed.  She has subsequently developed complete heart block.  She had pre-existing left bundle branch block on old ECGs which has likely progressed.  Unclear whether or not heart block is in any way related to her falls.  With her heart block, we would usually perform pacemaker implantation.  With her dementia, there is concern that she would contaminate her left chest pocket with a conventional transvenous device.  This would necessitate placement of a leadless pacemaker device which is higher risk.  Risk and benefits of pacemaker implantation were discussed with family.  More importantly, given her advanced dementia and functional status, I think it would be reasonable to not pursue any procedural intervention should the family choose to do so.  They will deliberate amongst themselves about her options and will let us  know how they would like to proceed.    Fonda Kennyth, MD 01/12/2025 9:46 PM       [1]  Allergies Allergen Reactions    Lamisil [Terbinafine] Rash   "

## 2025-01-12 NOTE — ED Provider Notes (Signed)
 Patient with high degree heart block.  Will admit to hospitalist.  Cardiology team has seen goals of care are taking place but anticipate putting pacemaker in Monday.  Hemodynamically stable.  Electrolytes thus far unremarkable.  Hemoglobin is unremarkable.  This chart was dictated using voice recognition software.  Despite best efforts to proofread,  errors can occur which can change the documentation meaning.    Ruthe Cornet, DO 01/12/25 1556

## 2025-01-13 LAB — BASIC METABOLIC PANEL WITH GFR
Anion gap: 10 (ref 5–15)
BUN: 9 mg/dL (ref 8–23)
CO2: 22 mmol/L (ref 22–32)
Calcium: 8.7 mg/dL — ABNORMAL LOW (ref 8.9–10.3)
Chloride: 105 mmol/L (ref 98–111)
Creatinine, Ser: 0.8 mg/dL (ref 0.44–1.00)
GFR, Estimated: >60 mL/min
Glucose, Bld: 98 mg/dL (ref 70–99)
Potassium: 4.6 mmol/L (ref 3.5–5.1)
Sodium: 137 mmol/L (ref 135–145)

## 2025-01-13 LAB — CBC
HCT: 42.6 % (ref 36.0–46.0)
Hemoglobin: 13.7 g/dL (ref 12.0–15.0)
MCH: 31.1 pg (ref 26.0–34.0)
MCHC: 32.2 g/dL (ref 30.0–36.0)
MCV: 96.6 fL (ref 80.0–100.0)
Platelets: 222 10*3/uL (ref 150–400)
RBC: 4.41 MIL/uL (ref 3.87–5.11)
RDW: 12.4 % (ref 11.5–15.5)
WBC: 15.6 10*3/uL — ABNORMAL HIGH (ref 4.0–10.5)
nRBC: 0 % (ref 0.0–0.2)

## 2025-01-13 MED ORDER — BUSPIRONE HCL 10 MG PO TABS
10.0000 mg | ORAL_TABLET | Freq: Two times a day (BID) | ORAL | Status: DC
  Administered 2025-01-13: 10 mg via ORAL
  Filled 2025-01-13: qty 1

## 2025-01-13 MED ORDER — PANTOPRAZOLE SODIUM 40 MG PO TBEC: 40.0000 mg | DELAYED_RELEASE_TABLET | Freq: Every day | ORAL | Status: DC

## 2025-01-13 MED ORDER — ESCITALOPRAM OXALATE 10 MG PO TABS
20.0000 mg | ORAL_TABLET | Freq: Every day | ORAL | Status: DC
  Administered 2025-01-13: 20 mg via ORAL
  Filled 2025-01-13: qty 2

## 2025-01-13 NOTE — Progress Notes (Signed)
 OT Cancellation Note  Patient Details Name: Candace Manning MRN: [id number] DOB: [birth year]   Cancelled Treatment:    Reason Eval/Treat Not Completed: Other (comment) (Per RN, pt is discharging back to her memory care facility. No OT needed.)  Nickoli Bagheri D Causey 01/13/2025, 12:12 PM

## 2025-01-13 NOTE — Discharge Summary (Signed)
 "  Physician Discharge Summary  Candace Manning FMW:[id number] DOB: [birth year] DOA: 01/12/2025  PCP: Aisha Harvey, MD  Admit date: 01/12/2025 Discharge date: 01/13/2025  Admitted from: Memory care unit Discharge disposition: Back to memory care unit  Recommendations at discharge:  Aricept  has been stopped.   Subjective: Patient was seen and examined this morning. Pleasant elderly Caucasian female.  Lying on bed.  Not in distress.  Husband and daughter at bedside. Also seen by cardiologist this morning. Overnight, heart rate has been 40s mostly, blood pressure maintained, breathing on room air Labs this morning with WC count 15.6.  Brief narrative: Candace Manning is a 79 y.o. female with PMH significant for advanced Alzheimer dementia, anxiety, hearing loss, urinary incontinence, GERD Patient lives at [facility] with memory care unit. At baseline, ambulatory without assistance.   2/5, patient was found on the floor by the facility staff, possible syncope, unknown downtime  In the ED, afebrile, initial heart rate was in the 60s, blood pressure 162/99, breathing on room air First EKG showed complete heart block with heart rate in 40s Second EKG showed second-degree type II AV block with multifocal PVCs. Labs with WBC count 17.4, hemoglobin 15.6, troponin 28, TSH normal CT head and cervical spine unremarkable for acute injuries. Cardiology was consulted Admitted to [facility]  For last year and a half, she is with advanced dementia, but ambulatory with no assistance. - She is poor historian, unable to provide history, sent was obtained from husband, and ED staff, apparently she was found on the floor with unwitnessed fall, possible syncope, her EKG in ED showing complete heart block, with escape rate in the 40s, husband reports she was not prescribed any new medicine, all her medicine ongoing for couple years, she is on Aricept  with he reports she has been on it for few years already, initial  labs with troponins of 28, hemoglobin of 15.6, TSH within normal limits at 1.32, EP was consulted by cardiology, and [facility] hospitalist consulted to admit  Hospital course: High-grade AV block Intermittent CHB Brought to the ED after found on the floor, possible syncope EKGs in the ED showed secondary type II AV block with intermittent CHB On medication review, patient not on any AV nodal blocking agent.  On Aricept  for dementia. Seen by EP.  Given her advanced dementia, not a candidate for pacemaker.   After discussion with family, decision was made to forego any procedure and discharged her back to memory care unit. Aricept  has been stopped because of its anticholinergic potential.  Syncope patient likely had syncope secondary to bradycardia leading to unwitnessed fall CT head and cervical spine without acute findings Electrolytes normal  Hypertension  Blood pressure initially was high.  Gradually down in the range Not on any antihypertensives   Prolonged QTc Initial QTc was 600, but this might be artificially low due to complete heart block with junctional rhyth Electrolytes normal Recent Labs  Lab 01/12/25 1502 01/12/25 1516 01/13/25 0245  K 4.5 4.2 4.6  MG 2.1  --   --    Dementia Anxiety/depression PTA meds-Aricept  5 g nightly, Namenda  10 mg twice daily, Seroquel  75 mg nightly, BuSpar  10 mg twice daily, Lexapro  20 mg daily, Ativan  0.25 mg 3 times daily, melatonin 5 mg nightly, Aricept  on hold because of bradycardia  Continue others   Goals of care   Code Status: Limited: Do not attempt resuscitation (DNR) -DNR-LIMITED -Do Not Intubate/DNI    Diet:  Diet Order  Diet general           Diet regular Room service appropriate? Yes; Fluid consistency: Thin  Diet effective now                   Nutritional status:  Body mass index is 21.28 kg/m.       Wounds:  -    Discharge Medications:   Allergies as of 01/13/2025       Reactions    Lamisil [terbinafine] Dermatitis, Rash        Medication List     STOP taking these medications    Augmentin 500-125 MG tablet Generic drug: amoxicillin-clavulanate   donepezil  5 MG tablet Commonly known as: ARICEPT        TAKE these medications    acetaminophen  325 MG tablet Commonly known as: TYLENOL  Take 650 mg by mouth every 6 (six) hours as needed.   busPIRone  10 MG tablet Commonly known as: BUSPAR  Take 10 mg by mouth 2 (two) times daily.   cholestyramine light 4 g packet Commonly known as: PREVALITE Take 1 packet by mouth 2 (two) times daily.   cyanocobalamin  1000 MCG tablet Commonly known as: VITAMIN B12 Take 1,000 mcg by mouth every other day.   escitalopram  20 MG tablet Commonly known as: LEXAPRO  Take 20 mg by mouth daily.   esomeprazole 40 MG capsule Commonly known as: NEXIUM Take 40 mg by mouth daily.   Hemorrhoidal Max St/Aloe 1-0.25-14.4-15 % Crea Generic drug: Pramox-PE-Glycerin-Petrolatum Apply 1 Application topically See admin instructions. Apply small amount topically to affected area twice daily. May apply additional applications as needed   latanoprost  0.005 % ophthalmic solution Commonly known as: XALATAN  Place 1 drop into both eyes at bedtime.   levocetirizine 5 MG tablet Commonly known as: XYZAL Take 5 mg by mouth at bedtime.   loperamide 2 MG tablet Commonly known as: IMODIUM A-D Take 2 mg by mouth 3 (three) times daily before meals.   LORazepam  0.5 MG tablet Commonly known as: ATIVAN  Take 0.25 mg by mouth in the morning, at noon, and at bedtime.   melatonin 5 MG Tabs Take 5 mg by mouth at bedtime.   memantine  10 MG tablet Commonly known as: NAMENDA  Take 10 mg by mouth 2 (two) times daily.   MULTIVITAMIN PO Take 1 tablet by mouth daily.   oyster calcium  500 MG Tabs tablet Take 500 mg of elemental calcium  by mouth daily. Chew 1 gummy by mouth once daily   Pedialyte Soln Take 240 mLs by mouth 2 (two) times daily with a  meal.   Probiotic Blend Caps Take 1 capsule by mouth daily.   SEROquel  50 MG tablet Generic drug: QUEtiapine  Take 50 mg by mouth at bedtime. Take 50 mg by mouth at bedtime *with 25 mg tablet to equal 75 mg*   QUEtiapine  25 MG tablet Commonly known as: SEROQUEL  Take 25 mg by mouth See admin instructions. Take 25 mg by mouth at bedtime *with 50 mg tablet to equal 75 mg*   timolol 0.5 % ophthalmic solution Commonly known as: TIMOPTIC Place 1 drop into both eyes 2 (two) times daily.   traMADol 50 MG tablet Commonly known as: ULTRAM Take 50 mg by mouth every 6 (six) hours as needed.   triamcinolone 55 MCG/ACT Aero nasal inhaler Commonly known as: NASACORT Place 2 sprays into the nose 2 (two) times daily.   Vitamin D 50 MCG (2000 UT) tablet Take 2,000 Units by mouth daily.  Follow ups:    Follow-up Information     Aisha Harvey, MD Follow up.   Specialty: Family Medicine Contact information: [address] [place] [address] [phone]                 Discharge Instructions:   Discharge Instructions     Call MD for:  difficulty breathing, headache or visual disturbances   Complete by: As directed    Call MD for:  extreme fatigue   Complete by: As directed    Call MD for:  hives   Complete by: As directed    Call MD for:  persistant dizziness or light-headedness   Complete by: As directed    Call MD for:  persistant nausea and vomiting   Complete by: As directed    Call MD for:  severe uncontrolled pain   Complete by: As directed    Call MD for:  temperature >100.4   Complete by: As directed    Diet general   Complete by: As directed    Discharge instructions   Complete by: As directed    Recommendations at discharge:   Aricept  has been stopped. General discharge instructions: Follow with Primary MD Aisha Harvey, MD in 7 days  Please request your PCP  to go over your hospital tests, procedures, radiology results at the follow  up. Please get your medicines reviewed and adjusted.  Your PCP may decide to repeat certain labs or tests as needed. Do not drive, operate heavy machinery, perform activities at heights, swimming or participation in water activities or provide baby sitting services if your were admitted for syncope or siezures until you have seen by Primary MD or a Neurologist and advised to do so again. [place]  Controlled Substance Reporting System database was reviewed. Do not drive, operate heavy machinery, perform activities at heights, swim, participate in water activities or provide baby-sitting services while on medications for pain, sleep and mood until your outpatient physician has reevaluated you and advised to do so again.  You are strongly recommended to comply with the dose, frequency and duration of prescribed medications. Activity: As tolerated with Full fall precautions use walker/cane & assistance as needed Avoid using any recreational substances like cigarette, tobacco, alcohol , or non-prescribed drug. If you experience worsening of your admission symptoms, develop shortness of breath, life threatening emergency, suicidal or homicidal thoughts you must seek medical attention immediately by calling 911 or calling your MD immediately  if symptoms less severe. You must read complete instructions/literature along with all the possible adverse reactions/side effects for all the medicines you take and that have been prescribed to you. Take any new medicine only after you have completely understood and accepted all the possible adverse reactions/side effects.  Wear Seat belts while driving. You were cared for by a hospitalist during your hospital stay. If you have any questions about your discharge medications or the care you received while you were in the hospital after you are discharged, you can call the unit and ask to speak with the hospitalist or the covering physician. Once you are discharged, your  primary care physician will handle any further medical issues. Please note that NO REFILLS for any discharge medications will be authorized once you are discharged, as it is imperative that you return to your primary care physician (or establish a relationship with a primary care physician if you do not have one).   Increase activity slowly   Complete by: As directed  Discharge Exam:   Vitals:   01/13/25 0430 01/13/25 0500 01/13/25 0600 01/13/25 0751  BP: 112/62 (!) 141/62 134/70 125/72  Pulse: (!) 42 (!) 43 (!) 47 66  Resp: 16 15 18 18   Temp:    98 F (36.7 C)  TempSrc:    Axillary  SpO2: 94% 95% 95% 98%  Weight:      Height:        Body mass index is 21.28 kg/m.  General exam: Pleasant, elderly Caucasian female.  Not in distress Skin: No rashes, lesions or ulcers. HEENT: Atraumatic, normocephalic, no obvious bleeding Lungs: Clear to auscultation bilaterally,  CVS: Sinus bradycardia at the time of my eval S1, S2, no murmur,   GI/Abd: Soft, nontender, nondistended, bowel sound present,   CNS: Alert, demented Psychiatry: Mood appropriate Extremities: No pedal edema, no calf tenderness,    The results of significant diagnostics from this hospitalization (including imaging, microbiology, ancillary and laboratory) are listed below for reference.    Procedures and Diagnostic Studies:   CT Cervical Spine Wo Contrast Result Date: 01/12/2025 EXAM: CT CERVICAL SPINE WITHOUT CONTRAST 01/12/2025 03:10:00 PM TECHNIQUE: CT of the cervical spine was performed without the administration of intravenous contrast. Multiplanar reformatted images are provided for review. Automated exposure control, iterative reconstruction, and/or weight based adjustment of the mA/kV was utilized to reduce the radiation dose to as low as reasonably achievable. COMPARISON: MRI of cervical spine 01/30/2006. CLINICAL HISTORY: Neck trauma (Age >= 65y). FINDINGS: BONES AND ALIGNMENT: There is mild kyphosis of the  cervical spine centered at C4-C5 without evidence of acute or traumatic vertebral column listhesis. No acute fracture or traumatic malalignment of the cervical spine. DEGENERATIVE CHANGES: There are multilevel degenerative changes. SOFT TISSUES: No prevertebral soft tissue swelling. IMPRESSION: 1. No acute fracture or traumatic malalignment of the cervical spine. Electronically signed by: Prentice Spade MD 01/12/2025 03:40 PM EST RP Workstation: [id number]   CT Head Wo Contrast Result Date: 01/12/2025 EXAM: CT HEAD WITHOUT CONTRAST 01/12/2025 03:10:00 PM TECHNIQUE: CT of the head was performed without the administration of intravenous contrast. Automated exposure control, iterative reconstruction, and/or weight based adjustment of the mA/kV was utilized to reduce the radiation dose to as low as reasonably achievable. COMPARISON: MRI brain 04/11/2017. CLINICAL HISTORY: Head trauma, minor (Age >= 65y). Head trauma, minor (Age >= 65y). FINDINGS: BRAIN AND VENTRICLES: No acute hemorrhage. No evidence of acute infarct. No hydrocephalus. No extra-axial collection. No mass effect or midline shift. There is parenchymal volume loss. Chronic right cerebellar infarction. There are moderate scattered white matter hypodensities which are nonspecific but most commonly represent chronic microvascular ischemic changes. ORBITS: No acute abnormality. SINUSES: Chronic bilateral maxillary sinusitis. SOFT TISSUES AND SKULL: Posterior scalp swelling. No calvarial fracture. IMPRESSION: 1. No acute intracranial abnormality. 2. No calvarial fracture. 3. Posterior scalp swelling. Electronically signed by: Prentice Spade MD 01/12/2025 03:27 PM EST RP Workstation: [id number]     Labs:   Basic Metabolic Panel: Recent Labs  Lab 01/12/25 1502 01/12/25 1516 01/13/25 0245  NA 138 141 137  K 4.5 4.2 4.6  CL 105 105 105  CO2 15*  --  22  GLUCOSE 100* 101* 98  BUN 10 10 9   CREATININE 0.99 1.00 0.80  CALCIUM  9.3  --  8.7*  MG 2.1   --   --    GFR Estimated Creatinine Clearance: 53.8 mL/min (by C-G formula based on SCr of 0.8 mg/dL). Liver Function Tests: No results for input(s): AST, ALT, ALKPHOS, BILITOT, PROT, ALBUMIN in  the last 168 hours. No results for input(s): LIPASE, AMYLASE in the last 168 hours. No results for input(s): AMMONIA in the last 168 hours. Coagulation profile No results for input(s): INR, PROTIME in the last 168 hours.  CBC: Recent Labs  Lab 01/12/25 1502 01/12/25 1516 01/13/25 0245  WBC 17.4*  --  15.6*  NEUTROABS 10.4*  --   --   HGB 14.9 15.6* 13.7  HCT 47.1* 46.0 42.6  MCV 98.3  --  96.6  PLT 269  --  222   Cardiac Enzymes: No results for input(s): CKTOTAL, CKMB, CKMBINDEX, TROPONINI in the last 168 hours. BNP: Invalid input(s): POCBNP CBG: No results for input(s): GLUCAP in the last 168 hours. D-Dimer No results for input(s): DDIMER in the last 72 hours. Hgb A1c No results for input(s): HGBA1C in the last 72 hours. Lipid Profile No results for input(s): CHOL, HDL, LDLCALC, TRIG, CHOLHDL, LDLDIRECT in the last 72 hours. Thyroid  function studies Recent Labs    01/12/25 1502  TSH 1.320   Anemia work up No results for input(s): VITAMINB12, FOLATE, FERRITIN, TIBC, IRON, RETICCTPCT in the last 72 hours. Microbiology No results found for this or any previous visit (from the past 240 hours).  Time coordinating discharge: 45 minutes  Signed: Mechell Girgis  [facility] 01/13/2025, 11:15 AM  "

## 2025-01-13 NOTE — Care Management CC44 (Signed)
"         Condition Code 44 Documentation Completed  Patient Details  Name: Candace Manning MRN: [id number] Date of Birth: Dec 27, 1945   Condition Code 44 given:  Yes Patient signature on Condition Code 44 notice:  Yes Documentation of 2 MD's agreement:  Yes Code 44 added to claim:  Yes    Nena LITTIE Coffee, RN 01/13/2025, 11:24 AM  "

## 2025-01-13 NOTE — Progress Notes (Cosign Needed Addendum)
"  °  Patient Name: Candace Manning Date of Encounter: 01/13/2025  Primary Cardiologist: None Electrophysiologist: New  Interval Summary   NAEO. Husband remained at beside and Daughter now present. Resting OK.  Family has discussed and has decided no surgeries.   Vital Signs    Vitals:   01/13/25 0430 01/13/25 0500 01/13/25 0600 01/13/25 0751  BP: 112/62 (!) 141/62 134/70 125/72  Pulse: (!) 42 (!) 43 (!) 47 66  Resp: 16 15 18 18   Temp:    98 F (36.7 C)  TempSrc:    Axillary  SpO2: 94% 95% 95% 98%  Weight:      Height:       No intake or output data in the 24 hours ending 01/13/25 0831 Filed Weights   01/12/25 1342  Weight: 58 kg    Physical Exam    GEN- Sleeping.  Lungs- Clear to ausculation bilaterally, normal work of breathing Cardiac- Somewhat irregular rate and rhythm GI- soft, NT, ND, + BS Extremities- no clubbing or cyanosis. No edema  Telemetry    Advanced AV block with HRs in 40-60s;  Occasional 2:1 conduction as well as CHB and junctional escape at times. (personally reviewed)  Hospital Course    Candace Manning is a 79 y.o. female with a history of Alzheimer's dementia, anxiety, hearing loss, urinary incontinence who is being seen today for the evaluation of complete heart block at the request of Dr. Levander.   Assessment & Plan    High grade AV block Intermittent Complete Heart Block Remains in CHB.  Unclear chronicity or relation to her fall given her severe dementia.  Pacemaker unlikely to significantly improve her trajectory, and either PPM type carries increased risk of infection and/or complication given her significant co-morbidities and inability to follow wound care and movement restrictions.   Family has discussed and do not desire any surgeries at this point with it being unlikely to change her trajectory.    Dementia Management per primary team.  Goals of care Family discussions ongoing  No plans for surgery at this time per family  discussion. Pt is a very poor candidate and unlikely to change her QOL or trajectory.   OK for discharge to appropriate facility with appropriate arrangements from EP perspective.   EP to see as needed while remains here.   For questions or updates, please contact [facility] Please consult [email or web address] for contact info under     Signed, Ozell Prentice Passey, PA-C  01/13/2025, 8:31 AM     "

## 2025-01-13 NOTE — Care Management Obs Status (Signed)
 MEDICARE OBSERVATION STATUS NOTIFICATION   Patient Details  Name: Candace Manning MRN: [id number] Date of Birth: 18-Jul-1946   Medicare Observation Status Notification Given:  Yes    Nena LITTIE Coffee, RN 01/13/2025, 11:24 AM
